# Patient Record
Sex: Male | Born: 1990 | Race: White | Hispanic: No | Marital: Married | Smoking: Never smoker
Health system: Southern US, Community
[De-identification: ages and names within clinical notes are randomized; demographics above are authoritative.]

## PROBLEM LIST (undated history)

## (undated) DIAGNOSIS — N189 Chronic kidney disease, unspecified: Secondary | ICD-10-CM

## (undated) DIAGNOSIS — Z789 Other specified health status: Secondary | ICD-10-CM

## (undated) DIAGNOSIS — I499 Cardiac arrhythmia, unspecified: Secondary | ICD-10-CM

## (undated) DIAGNOSIS — I10 Essential (primary) hypertension: Secondary | ICD-10-CM

## (undated) DIAGNOSIS — M1990 Unspecified osteoarthritis, unspecified site: Secondary | ICD-10-CM

## (undated) DIAGNOSIS — Z87442 Personal history of urinary calculi: Secondary | ICD-10-CM

## (undated) HISTORY — PX: NO PAST SURGERIES: SHX2092

---

## 2021-01-09 ENCOUNTER — Other Ambulatory Visit: Payer: Self-pay

## 2021-01-09 ENCOUNTER — Ambulatory Visit
Admission: EM | Admit: 2021-01-09 | Discharge: 2021-01-09 | Disposition: A | Discharge status: Home / Self Care | Payer: Self-pay | Attending: Physician Assistant | Admitting: Physician Assistant

## 2021-01-09 DIAGNOSIS — J029 Acute pharyngitis, unspecified: Secondary | ICD-10-CM | POA: Insufficient documentation

## 2021-01-09 LAB — POCT RAPID STREP A (OFFICE): Rapid Strep A Screen: NEGATIVE

## 2021-01-09 MED ORDER — AMOXICILLIN 500 MG PO CAPS: 500.0000 mg | ORAL_CAPSULE | Freq: Three times a day (TID) | ORAL | 0 refills | Status: DC

## 2021-01-09 NOTE — ED Triage Notes (Signed)
Pt c/o sore throat and nasal congestion x4 days. C/o white patches on tonsils and pain on swallowing.

## 2021-01-09 NOTE — ED Provider Notes (Signed)
[facility]    CSN: [id number] Arrival date & time: 01/09/21  1634      History   Chief Complaint Chief Complaint  Patient presents with   Sore Throat    HPI Vincent Doyle is a 30 y.o. male.   Patient here today for evaluation of sore throat, nasal congestion and drainage, and mild cough from drainage that started about 5 days ago.  He reports that he has had sore throat wax and wane.  Initially he thought symptoms were related to allergies specifically after he walked into a hoarders apartment without realizing that he needed a mask.  He has not had any fever or chills.  He denies any nausea, vomiting or diarrhea.  He has tried over-the-counter sinus medication without significant relief.  The history is provided by the patient.  Sore Throat Pertinent negatives include no abdominal pain and no shortness of breath.   History reviewed. No pertinent past medical history.  There are no problems to display for this patient.   History reviewed. No pertinent surgical history.     Home Medications    Prior to Admission medications   Medication Sig Start Date End Date Taking? Authorizing Provider  amoxicillin (AMOXIL) 500 MG capsule Take 1 capsule (500 mg total) by mouth 3 (three) times daily. 01/09/21  Yes Tomi Bamberger, PA-C    Family History Family History  Problem Relation Age of Onset   Heart failure Father    Hypertension Father     Social History Social History   Tobacco Use   Smoking status: Never   Smokeless tobacco: Never  Vaping Use   Vaping Use: Never used  Substance Use Topics   Alcohol use: Not Currently   Drug use: Not Currently     Allergies   Patient has no known allergies.   Review of Systems Review of Systems  Constitutional:  Negative for chills and fever.  HENT:  Positive for congestion and sore throat. Negative for ear pain.   Eyes:  Negative for discharge and redness.  Respiratory:  Positive for cough. Negative  for shortness of breath.   Gastrointestinal:  Negative for abdominal pain, nausea and vomiting.    Physical Exam Triage Vital Signs ED Triage Vitals [01/09/21 1758]  Enc Vitals Group     BP 104/80     Pulse Rate 98     Resp 18     Temp 98.5 F (36.9 C)     Temp Source Oral     SpO2 97 %     Weight      Height      Head Circumference      Peak Flow      Pain Score 1     Pain Loc      Pain Edu?      Excl. in GC?    No data found.  Updated Vital Signs BP 104/80   Pulse 98   Temp 98.5 F (36.9 C) (Oral)   Resp 18   SpO2 97%   Physical Exam Vitals and nursing note reviewed.  Constitutional:      General: He is not in acute distress.    Appearance: Normal appearance. He is not ill-appearing.  HENT:     Head: Normocephalic and atraumatic.     Nose: Nose normal. No congestion.     Mouth/Throat:     Mouth: Mucous membranes are moist.     Pharynx: Posterior oropharyngeal erythema present. No oropharyngeal exudate.  Tonsils: Tonsillar exudate present. 3+ on the right. 3+ on the left.  Eyes:     Conjunctiva/sclera: Conjunctivae normal.  Cardiovascular:     Rate and Rhythm: Normal rate and regular rhythm.     Heart sounds: Normal heart sounds. No murmur heard. Pulmonary:     Effort: Pulmonary effort is normal. No respiratory distress.     Breath sounds: Normal breath sounds. No wheezing, rhonchi or rales.  Skin:    General: Skin is warm and dry.  Neurological:     Mental Status: He is alert.  Psychiatric:        Mood and Affect: Mood normal.        Thought Content: Thought content normal.     UC Treatments / Results  Labs (all labs ordered are listed, but only abnormal results are displayed) Labs Reviewed  CULTURE, GROUP A STREP [facility])  POCT RAPID STREP A (OFFICE)    EKG   Radiology No results found.  Procedures Procedures (including critical care time)  Medications Ordered in UC Medications - No data to display  Initial Impression /  Assessment and Plan / UC Course  I have reviewed the triage vital signs and the nursing notes.  Pertinent labs & imaging results that were available during my care of the patient were reviewed by me and considered in my medical decision making (see chart for details).    Discussed possible viral versus allergic cause of symptoms but will treat with antibiotic given appearance of tonsils.  Her symptomatic treatment otherwise.  Recommended follow-up if symptoms fail to improve or worsen anyway.  Final Clinical Impressions(s) / UC Diagnoses   Final diagnoses:  Acute pharyngitis, unspecified etiology   Discharge Instructions   None    ED Prescriptions     Medication Sig Dispense Auth. Provider   amoxicillin (AMOXIL) 500 MG capsule Take 1 capsule (500 mg total) by mouth 3 (three) times daily. 21 capsule Tomi Bamberger, PA-C      PDMP not reviewed this encounter.   Tomi Bamberger, PA-C 01/09/21 1830

## 2021-01-13 LAB — CULTURE, GROUP A STREP (THRC)

## 2021-03-05 ENCOUNTER — Other Ambulatory Visit: Payer: Self-pay

## 2021-03-05 ENCOUNTER — Encounter: Payer: Self-pay | Admitting: Emergency Medicine

## 2021-03-05 ENCOUNTER — Ambulatory Visit
Admission: EM | Admit: 2021-03-05 | Discharge: 2021-03-05 | Disposition: A | Discharge status: Home / Self Care | Payer: Self-pay | Attending: Physician Assistant | Admitting: Physician Assistant

## 2021-03-05 DIAGNOSIS — J209 Acute bronchitis, unspecified: Secondary | ICD-10-CM

## 2021-03-05 MED ORDER — PREDNISONE 20 MG PO TABS: 40.0000 mg | ORAL_TABLET | Freq: Every day | ORAL | 0 refills | Status: AC

## 2021-03-05 NOTE — ED Triage Notes (Addendum)
Persistent cough since having a URI in October. Today starting feeling short of breath. Worsens with activity, talking. Productive cough with clear/white mucus. Noticed last week his ankles appear more swollen.

## 2021-03-05 NOTE — ED Provider Notes (Signed)
[facility]    CSN: [id number] Arrival date & time: 03/05/21  1520      History   Chief Complaint No chief complaint on file.   HPI Vincent Doyle is a 30 y.o. male.   Patient here today for evaluation of cough he has had for the last several months.  He reports that today he has felt more short of breath.  He reports cough is productive at times.  He has not had fever.  He has tried over-the-counter medication without significant relief.  The history is provided by the patient.   History reviewed. No pertinent past medical history.  There are no problems to display for this patient.   History reviewed. No pertinent surgical history.     Home Medications    Prior to Admission medications   Medication Sig Start Date End Date Taking? Authorizing Provider  predniSONE (DELTASONE) 20 MG tablet Take 2 tablets (40 mg total) by mouth daily with breakfast for 5 days. 03/05/21 03/10/21 Yes Tomi Bamberger, PA-C  amoxicillin (AMOXIL) 500 MG capsule Take 1 capsule (500 mg total) by mouth 3 (three) times daily. 01/09/21   Tomi Bamberger, PA-C    Family History Family History  Problem Relation Age of Onset   Heart failure Father    Hypertension Father     Social History Social History   Tobacco Use   Smoking status: Never   Smokeless tobacco: Never  Vaping Use   Vaping Use: Never used  Substance Use Topics   Alcohol use: Not Currently   Drug use: Not Currently     Allergies   Patient has no known allergies.   Review of Systems Review of Systems  Constitutional:  Negative for chills and fever.  HENT:  Positive for congestion. Negative for ear pain and sore throat.   Eyes:  Negative for discharge and redness.  Respiratory:  Positive for cough and shortness of breath.   Gastrointestinal:  Negative for abdominal pain, nausea and vomiting.    Physical Exam Triage Vital Signs ED Triage Vitals  Enc Vitals Group     BP      Pulse      Resp       Temp      Temp src      SpO2      Weight      Height      Head Circumference      Peak Flow      Pain Score      Pain Loc      Pain Edu?      Excl. in GC?    No data found.  Updated Vital Signs BP (!) 157/84 (BP Location: Left Arm)    Pulse 97    Temp 98.2 F (36.8 C)    Resp 16    SpO2 96%     Physical Exam Vitals and nursing note reviewed.  Constitutional:      General: He is not in acute distress.    Appearance: Normal appearance. He is not ill-appearing.  HENT:     Head: Normocephalic and atraumatic.     Nose: Nose normal. No congestion.     Mouth/Throat:     Mouth: Mucous membranes are moist.     Pharynx: Oropharynx is clear. No oropharyngeal exudate or posterior oropharyngeal erythema.  Eyes:     Conjunctiva/sclera: Conjunctivae normal.  Cardiovascular:     Rate and Rhythm: Normal rate and regular rhythm.  Heart sounds: Normal heart sounds. No murmur heard. Pulmonary:     Effort: Pulmonary effort is normal. No respiratory distress.     Breath sounds: Normal breath sounds. No wheezing, rhonchi or rales.     Comments: Deep breathing produces cough Skin:    General: Skin is warm and dry.  Neurological:     Mental Status: He is alert.  Psychiatric:        Mood and Affect: Mood normal.        Thought Content: Thought content normal.     UC Treatments / Results  Labs (all labs ordered are listed, but only abnormal results are displayed) Labs Reviewed - No data to display  EKG   Radiology No results found.  Procedures Procedures (including critical care time)  Medications Ordered in UC Medications - No data to display  Initial Impression / Assessment and Plan / UC Course  I have reviewed the triage vital signs and the nursing notes.  Pertinent labs & imaging results that were available during my care of the patient were reviewed by me and considered in my medical decision making (see chart for details).    Prednisone burst prescribed for  treatment of suspected bronchitis.  Recommend follow-up with any further concerns.  Final Clinical Impressions(s) / UC Diagnoses   Final diagnoses:  Acute bronchitis, unspecified organism   Discharge Instructions   None    ED Prescriptions     Medication Sig Dispense Auth. Provider   predniSONE (DELTASONE) 20 MG tablet Take 2 tablets (40 mg total) by mouth daily with breakfast for 5 days. 10 tablet Tomi Bamberger, PA-C      PDMP not reviewed this encounter.   Tomi Bamberger, PA-C 03/05/21 1724

## 2021-04-06 ENCOUNTER — Emergency Department (HOSPITAL_COMMUNITY)
Admission: EM | Admit: 2021-04-06 | Discharge: 2021-04-06 | Disposition: A | Discharge status: Home / Self Care | Payer: Self-pay | Attending: Emergency Medicine | Admitting: Emergency Medicine

## 2021-04-06 ENCOUNTER — Other Ambulatory Visit: Payer: Self-pay

## 2021-04-06 ENCOUNTER — Encounter (HOSPITAL_COMMUNITY): Payer: Self-pay

## 2021-04-06 ENCOUNTER — Emergency Department (HOSPITAL_COMMUNITY): Payer: Self-pay

## 2021-04-06 DIAGNOSIS — R002 Palpitations: Secondary | ICD-10-CM | POA: Insufficient documentation

## 2021-04-06 DIAGNOSIS — R000 Tachycardia, unspecified: Secondary | ICD-10-CM | POA: Insufficient documentation

## 2021-04-06 LAB — CBC WITH DIFFERENTIAL/PLATELET
Abs Immature Granulocytes: 0.02 10*3/uL (ref 0.00–0.07)
Basophils Absolute: 0.1 10*3/uL (ref 0.0–0.1)
Basophils Relative: 1 %
Eosinophils Absolute: 0.1 10*3/uL (ref 0.0–0.5)
Eosinophils Relative: 2 %
HCT: 45.6 % (ref 39.0–52.0)
Hemoglobin: 15.2 g/dL (ref 13.0–17.0)
Immature Granulocytes: 0 %
Lymphocytes Relative: 26 %
Lymphs Abs: 1.3 10*3/uL (ref 0.7–4.0)
MCH: 28.6 pg (ref 26.0–34.0)
MCHC: 33.3 g/dL (ref 30.0–36.0)
MCV: 85.9 fL (ref 80.0–100.0)
Monocytes Absolute: 0.4 10*3/uL (ref 0.1–1.0)
Monocytes Relative: 8 %
Neutro Abs: 3.2 10*3/uL (ref 1.7–7.7)
Neutrophils Relative %: 63 %
Platelets: 271 10*3/uL (ref 150–400)
RBC: 5.31 MIL/uL (ref 4.22–5.81)
RDW: 13.2 % (ref 11.5–15.5)
WBC: 5.1 10*3/uL (ref 4.0–10.5)
nRBC: 0 % (ref 0.0–0.2)

## 2021-04-06 LAB — BASIC METABOLIC PANEL
Anion gap: 5 (ref 5–15)
BUN: 14 mg/dL (ref 6–20)
CO2: 28 mmol/L (ref 22–32)
Calcium: 8.9 mg/dL (ref 8.9–10.3)
Chloride: 107 mmol/L (ref 98–111)
Creatinine, Ser: 0.54 mg/dL — ABNORMAL LOW (ref 0.61–1.24)
GFR, Estimated: >60 mL/min (ref >60)
Glucose, Bld: 92 mg/dL (ref 70–99)
Potassium: 3.8 mmol/L (ref 3.5–5.1)
Sodium: 140 mmol/L (ref 135–145)

## 2021-04-06 LAB — TSH: TSH: 0.861 u[IU]/mL (ref 0.350–4.500)

## 2021-04-06 NOTE — ED Provider Notes (Signed)
[facility]-EMERGENCY DEPT Provider Note   CSN: [id number] Arrival date & time: 04/06/21  1528     History  Chief Complaint  Patient presents with   Palpitations    Vincent Doyle is a 31 y.o. male.  31 year old male with prior medical history as detailed below presents for evaluation.  Patient reports intermittent sensation of palpitations today.  Patient reports feeling "extra heartbeats" intermittently for the last several hours.  Patient reports that he will feel extra beats for several seconds at a time.  Symptoms then go away but will recur within 20 to 30 minutes.  He denies associated chest pain, dizziness, nausea, vomiting, shortness of breath, or other complaint.  He denies prior episodes of palpitations.  He denies any specific inciting event.  He is comfortable during the evaluation.  The history is provided by medical records and the patient.  Palpitations Palpitations quality:  Regular Onset quality:  Sudden Duration:  5 seconds Timing:  Intermittent Progression:  Waxing and waning Chronicity:  New Relieved by:  Nothing Worsened by:  Nothing     Home Medications Prior to Admission medications   Medication Sig Start Date End Date Taking? Authorizing Provider  amoxicillin (AMOXIL) 500 MG capsule Take 1 capsule (500 mg total) by mouth 3 (three) times daily. 01/09/21   Tomi Bamberger, PA-C      Allergies    Patient has no known allergies.    Review of Systems   Review of Systems  Cardiovascular:  Positive for palpitations.  All other systems reviewed and are negative.  Physical Exam Updated Vital Signs BP (!) 169/99 (BP Location: Left Arm)    Pulse (!) 102    Temp 98.4 F (36.9 C) (Oral)    Resp 14    Ht 6\' 4"  (1.93 m)    Wt (!) 147.4 kg    SpO2 99%    BMI 39.56 kg/m  Physical Exam Vitals and nursing note reviewed.  Constitutional:      General: He is not in acute distress.    Appearance: Normal appearance. He is  well-developed.  HENT:     Head: Normocephalic and atraumatic.  Eyes:     Conjunctiva/sclera: Conjunctivae normal.     Pupils: Pupils are equal, round, and reactive to light.  Cardiovascular:     Rate and Rhythm: Normal rate and regular rhythm.     Heart sounds: Normal heart sounds.  Pulmonary:     Effort: Pulmonary effort is normal. No respiratory distress.     Breath sounds: Normal breath sounds.  Abdominal:     General: There is no distension.     Palpations: Abdomen is soft.     Tenderness: There is no abdominal tenderness.  Musculoskeletal:        General: No deformity. Normal range of motion.     Cervical back: Normal range of motion and neck supple.  Skin:    General: Skin is warm and dry.  Neurological:     General: No focal deficit present.     Mental Status: He is alert and oriented to person, place, and time.    ED Results / Procedures / Treatments   Labs (all labs ordered are listed, but only abnormal results are displayed) Labs Reviewed  BASIC METABOLIC PANEL - Abnormal; Notable for the following components:      Result Value   Creatinine, Ser 0.54 (*)    All other components within normal limits  CBC WITH DIFFERENTIAL/PLATELET  TSH  EKG EKG Interpretation  Date/Time:  Sunday April 06 2021 15:37:04 EST Ventricular Rate:  101 PR Interval:  168 QRS Duration: 114 QT Interval:  345 QTC Calculation: 448 R Axis:   66 Text Interpretation: Sinus tachycardia Anterior infarct, old Confirmed by Kristine Royal [phone]) on 04/06/2021 3:55:38 PM  Radiology DG Chest 2 View  Result Date: 04/06/2021 CLINICAL DATA:  Palpitations for 2 hours.  Chronic cough. EXAM: CHEST - 2 VIEW COMPARISON:  None. FINDINGS: The heart size and mediastinal contours are within normal limits. Both lungs are clear. The visualized skeletal structures are unremarkable. IMPRESSION: No active cardiopulmonary disease. Electronically Signed   By: Danae Orleans M.D.   On: 04/06/2021 17:07     Procedures Procedures    Medications Ordered in ED Medications - No data to display  ED Course/ Medical Decision Making/ A&P                           Medical Decision Making Amount and/or Complexity of Data Reviewed Labs: ordered. Radiology: ordered.    Medical Screen Complete  This patient presented to the ED with complaint of palpitations.  This complaint involves an extensive number of treatment options. The initial differential diagnosis includes, but is not limited to, PVC, arrhythmia, metabolic abnormality, thyroid dysfunction, etc.  This presentation is: Acute, Previously Undiagnosed, Uncertain Prognosis, Complicated, Systemic Symptoms, and Threat to Life/Bodily Function  Patient presented with complaint of palpitations.  Patient's described symptoms are not related to other more significant symptomatology such as chest pain or shortness of breath.  Patient's work-up is without evidence of acute pathology.  Patient is reassured by work-up.  Importance of close follow-up is stressed.  Strict return precautions given understood.     Additional history obtained:  Additional history obtained from Spouse External records from outside sources obtained and reviewed including prior ED visits and prior Inpatient records.    Lab Tests:  I ordered and personally interpreted labs.  The pertinent results include:  cbc bmp tsh   Imaging Studies ordered:  I ordered imaging studies including cxr  I independently visualized and interpreted obtained imaging which showed nad I agree with the radiologist interpretation.   Cardiac Monitoring:  The patient was maintained on a cardiac monitor.  I personally viewed and interpreted the cardiac monitor which showed an underlying rhythm of: nsr      Problem List / ED Course:  Palpitations   Reevaluation:  After the interventions noted above, I reevaluated the patient and found that they have:  improved   Disposition:  After consideration of the diagnostic results and the patients response to treatment, I feel that the patent would benefit from close outpatient followup.          Final Clinical Impression(s) / ED Diagnoses Final diagnoses:  Palpitations    Rx / DC Orders ED Discharge Orders     None         [name] Fines, MD 04/06/21 1851

## 2021-04-06 NOTE — ED Triage Notes (Signed)
Pt c/o heart palpitations since 1300. Pt states he can feel the heartbeat, becomes SOB during. Denies CP, n/v, dizziness, states has not seen PMD in 10+ years

## 2021-04-06 NOTE — Discharge Instructions (Signed)
Return for any problem.  ?

## 2021-11-04 ENCOUNTER — Ambulatory Visit
Admission: EM | Admit: 2021-11-04 | Discharge: 2021-11-04 | Disposition: A | Discharge status: Home / Self Care | Payer: Self-pay | Attending: Urgent Care | Admitting: Urgent Care

## 2021-11-04 ENCOUNTER — Ambulatory Visit (INDEPENDENT_AMBULATORY_CARE_PROVIDER_SITE_OTHER): Payer: Self-pay

## 2021-11-04 DIAGNOSIS — M5412 Radiculopathy, cervical region: Secondary | ICD-10-CM

## 2021-11-04 DIAGNOSIS — R200 Anesthesia of skin: Secondary | ICD-10-CM

## 2021-11-04 DIAGNOSIS — R202 Paresthesia of skin: Secondary | ICD-10-CM

## 2021-11-04 DIAGNOSIS — M542 Cervicalgia: Secondary | ICD-10-CM

## 2021-11-04 MED ORDER — TIZANIDINE HCL 4 MG PO TABS: 4.0000 mg | ORAL_TABLET | Freq: Every day | ORAL | 0 refills | Status: DC

## 2021-11-04 MED ORDER — NAPROXEN 375 MG PO TABS: 375.0000 mg | ORAL_TABLET | Freq: Two times a day (BID) | ORAL | 0 refills | Status: DC

## 2021-11-04 MED ORDER — PREDNISONE 50 MG PO TABS
50.0000 mg | ORAL_TABLET | Freq: Every day | ORAL | 0 refills | Status: DC
Start: 2021-11-04 — End: 2023-09-15

## 2021-11-04 NOTE — ED Triage Notes (Signed)
Pt. Stated he was moving furniture Friday.Pt. is c/o of neck pain that radiates to the shoulder blades since Saturday. Pt. Has been treating w/ tylenol and it has not helped.

## 2021-11-04 NOTE — ED Provider Notes (Signed)
[facility]   MRN: [id number] DOB: [birth year]  Subjective:   Vincent Doyle is a 31 y.o. male presenting for 5-day history of acute onset persistent neck pain worse to the right with radiation down his trapezius toward the shoulder blades internally.  Is also had radiation toward the right side including numbness and tingling of the right arm.  Symptoms started after he was trying to move some furniture.  Reports that it was not excessive lifting as much as it was lifting together with someone else and trying to position pieces of furniture into a truck bed.  No direct trauma to the neck, falls.  No history of musculoskeletal disorders.  No current facility-administered medications for this encounter.  Current Outpatient Medications:    amoxicillin (AMOXIL) 500 MG capsule, Take 1 capsule (500 mg total) by mouth 3 (three) times daily., Disp: 21 capsule, Rfl: 0   No Known Allergies  History reviewed. No pertinent past medical history.   History reviewed. No pertinent surgical history.  Family History  Problem Relation Age of Onset   Heart failure Father    Hypertension Father     Social History   Tobacco Use   Smoking status: Never   Smokeless tobacco: Never  Vaping Use   Vaping Use: Never used  Substance Use Topics   Alcohol use: Not Currently   Drug use: Not Currently    ROS   Objective:   Vitals: BP (!) 146/103   Pulse 94   Temp 98.6 F (37 C)   Resp 18   SpO2 96%   BP Readings from Last 3 Encounters:  11/04/21 (!) 146/103  04/06/21 134/82  03/05/21 (!) 157/84   Physical Exam Constitutional:      General: He is not in acute distress.    Appearance: Normal appearance. He is well-developed and normal weight. He is not ill-appearing, toxic-appearing or diaphoretic.  HENT:     Head: Normocephalic and atraumatic.     Right Ear: External ear normal.     Left Ear: External ear normal.     Nose: Nose normal.     Mouth/Throat:      Pharynx: Oropharynx is clear.  Eyes:     General: No scleral icterus.       Right eye: No discharge.        Left eye: No discharge.     Extraocular Movements: Extraocular movements intact.  Cardiovascular:     Rate and Rhythm: Normal rate.  Pulmonary:     Effort: Pulmonary effort is normal.  Musculoskeletal:     Cervical back: Spasms and tenderness (Worse to the right paraspinal muscles, positive Spurling maneuver to the right) present. No swelling, edema, deformity, erythema, signs of trauma, lacerations, rigidity, torticollis, bony tenderness or crepitus. Pain with movement present. Decreased range of motion.  Neurological:     Mental Status: He is alert and oriented to person, place, and time.     Motor: No weakness.     Coordination: Coordination normal.     Gait: Gait normal.     Deep Tendon Reflexes: Reflexes normal.  Psychiatric:        Mood and Affect: Mood normal.        Behavior: Behavior normal.        Thought Content: Thought content normal.        Judgment: Judgment normal.     X-ray result did not crossover but was negative for an acute process.  Patient did have  loss of lordosis.   Assessment and Plan :   PDMP not reviewed this encounter.  1. Cervical radiculopathy   2. Neck pain   3. Numbness and tingling of right arm    Given the elements of cervical radiculopathy, recommended an oral prednisone course for the next 3 days.  Use naproxen for pain and inflammation thereafter.  Can start tizanidine today.  Follow-up with the neuro spine specialist if symptoms continue. Counseled patient on potential for adverse effects with medications prescribed/recommended today, ER and return-to-clinic precautions discussed, patient verbalized understanding.    Wallis Bamberg, [place] 11/04/21 1241

## 2022-05-17 ENCOUNTER — Other Ambulatory Visit: Payer: Self-pay

## 2022-05-17 ENCOUNTER — Emergency Department (HOSPITAL_BASED_OUTPATIENT_CLINIC_OR_DEPARTMENT_OTHER)
Admission: EM | Admit: 2022-05-17 | Discharge: 2022-05-17 | Disposition: A | Discharge status: Home / Self Care | Payer: Self-pay | Attending: Emergency Medicine | Admitting: Emergency Medicine

## 2022-05-17 DIAGNOSIS — Z20822 Contact with and (suspected) exposure to covid-19: Secondary | ICD-10-CM | POA: Insufficient documentation

## 2022-05-17 DIAGNOSIS — J101 Influenza due to other identified influenza virus with other respiratory manifestations: Secondary | ICD-10-CM | POA: Insufficient documentation

## 2022-05-17 DIAGNOSIS — R000 Tachycardia, unspecified: Secondary | ICD-10-CM | POA: Insufficient documentation

## 2022-05-17 LAB — RESP PANEL BY RT-PCR (RSV, FLU A&B, COVID)  RVPGX2
Influenza A by PCR: NEGATIVE
Influenza B by PCR: POSITIVE — AB
Resp Syncytial Virus by PCR: NEGATIVE
SARS Coronavirus 2 by RT PCR: NEGATIVE

## 2022-05-17 MED ORDER — ACETAMINOPHEN 500 MG PO TABS: 500.0000 mg | ORAL_TABLET | Freq: Four times a day (QID) | ORAL | 0 refills | Status: DC | PRN

## 2022-05-17 MED ORDER — BENZONATATE 100 MG PO CAPS: 100.0000 mg | ORAL_CAPSULE | Freq: Three times a day (TID) | ORAL | 0 refills | Status: DC

## 2022-05-17 NOTE — ED Provider Notes (Signed)
[facility] Provider Note   CSN: [id number] Arrival date & time: 05/17/22  1112     History  Chief Complaint  Patient presents with   URI   Cough    Vincent Doyle is a 32 y.o. male.  The history is provided by the patient and medical records. No language interpreter was used.     32 year old male presenting to ED with flulike symptoms.  Patient report for the past 4 days he has had fever, chills, headache, body aches, congestion, coughing, sore throat, diarrhea, decrease in appetite, heart palpitation and overall not feeling well.  Endorsing lightheadedness.  He has tried numerous over-the-counter medication with some relief.  States his wife initially started with similar symptoms and is here with the same sickness.  He denies alcohol or tobacco use.  No shortness of breath no urinary symptoms.  Home Medications Prior to Admission medications   Medication Sig Start Date End Date Taking? Authorizing Provider  amoxicillin (AMOXIL) 500 MG capsule Take 1 capsule (500 mg total) by mouth 3 (three) times daily. 01/09/21   Francene Finders, PA-C  naproxen (NAPROSYN) 375 MG tablet Take 1 tablet (375 mg total) by mouth 2 (two) times daily with a meal. 11/04/21   Jaynee Eagles, PA-C  predniSONE (DELTASONE) 50 MG tablet Take 1 tablet (50 mg total) by mouth daily with breakfast. 11/04/21   Jaynee Eagles, PA-C  tiZANidine (ZANAFLEX) 4 MG tablet Take 1 tablet (4 mg total) by mouth at bedtime. 11/04/21   Jaynee Eagles, PA-C      Allergies    Patient has no known allergies.    Review of Systems   Review of Systems  All other systems reviewed and are negative.   Physical Exam Updated Vital Signs BP 122/75   Pulse 92   Temp 99 F (37.2 C) (Oral)   Resp 10   Ht '6\' 4"'$  (1.93 m)   Wt (!) 147.4 kg   SpO2 90%   BMI 39.56 kg/m  Physical Exam Vitals and nursing note reviewed.  Constitutional:      General: He is not in acute distress.     Appearance: He is well-developed.  HENT:     Head: Atraumatic.     Nose: Nose normal.     Mouth/Throat:     Mouth: Mucous membranes are moist.  Eyes:     Conjunctiva/sclera: Conjunctivae normal.  Cardiovascular:     Rate and Rhythm: Tachycardia present.  Pulmonary:     Effort: Pulmonary effort is normal.     Breath sounds: Normal breath sounds. No wheezing, rhonchi or rales.  Abdominal:     Palpations: Abdomen is soft.     Tenderness: There is no abdominal tenderness.  Musculoskeletal:     Cervical back: Neck supple.  Skin:    Findings: No rash.  Neurological:     Mental Status: He is alert.     ED Results / Procedures / Treatments   Labs (all labs ordered are listed, but only abnormal results are displayed) Labs Reviewed  RESP PANEL BY RT-PCR (RSV, FLU A&B, COVID)  [id number] - Abnormal; Notable for the following components:      Result Value   Influenza B by PCR POSITIVE (*)    All other components within normal limits    EKG None  Radiology No results found.  Procedures Procedures    Medications Ordered in ED Medications - No data to display  ED Course/ Medical Decision Making/  A&P                             Medical Decision Making  BP (!) 126/57 (BP Location: Right Arm)   Pulse (!) 105   Temp 99 F (37.2 C) (Oral)   Resp 20   Ht '6\' 4"'$  (1.93 m)   Wt (!) 147.4 kg   SpO2 98%   BMI 39.56 kg/m    32 y.o. male presenting with flu sxs.  Obtained influenza A/B screen, which revealed positive influenza B.  At this time, patient's presentation most consistent with influenza.  The following were considered in the patient's differential diagnosis but was not deemed to be consistent with patient's history of present illness and/or physical examination; meningitis, pharyngitis, otitis media, pneumonia, urinary tract infection, peritonsillar abscess, retropharyngeal abscess.  As patient does not present w/ any signs/symptoms of pneumonia or other complications,  deferred CXR or further labwork at this time. Educated patient on diagnosis and natural course of influenza.  Supportive care and preventive measures were discussed.  Continue fluid hydration. Follow up with primary physician in 3-5 days if symptoms continue or new problems arise. Return to ED if high fever, altered mental status, shortness of breath, uncontrolled vomiting, or other concers.  Impression: Influenza B  Plan:    * Discharge from ED      * Advised Pt on support therapies, including rest, advancement of fluids as tolerated, thorough handwashing w/ soap and H2O, taking OTC ibuprofen or acetaminophen as directed prn for F/C and myalgias, OTC expectorant/antitussive/decongestants as directed prn, and refraining from taking ASA.   * Advised Pt to refrain from visiting work, school, or daycares or visiting pregnant women, elderly, or those w/ chronic illnesses.   * Advised Pt to obtain annual influenza vaccine upon resolution of CC.   * Advised Pt to monitor for dyspnea, respiratory distress, worsening F/C, and signs of dehydration (xerostomia, polydipsia, oliguria, weakness, and constitutional Sx). Instructed Pt to f/up w/ PCP or ER should Sx worsen or not improve. Pt verbally expressed understanding and all questions were addressed to Pt's satisfaction.         Final Clinical Impression(s) / ED Diagnoses Final diagnoses:  None    Rx / DC Orders ED Discharge Orders     None         Domenic Moras, PA-C 05/17/22 1307    Gareth Morgan, MD 05/18/22 0003

## 2022-05-17 NOTE — ED Triage Notes (Signed)
Patient arrives ambulatory to room with complaints of cough, congestion/fatigue, and feeling like he has a fever x4 days.   Also reports feeling lightheaded (as if he will pass out, per patient).

## 2022-05-17 NOTE — ED Notes (Signed)
Discharge paperwork given and verbally understood. 

## 2023-01-15 IMAGING — DX DG CHEST 2V
2 series · 2 of 2 positions shown · non-contrast
Comparison: None.

CLINICAL DATA: Palpitations for 2 hours.  Chronic cough.

EXAM:
CHEST - 2 VIEW

[chest pa]
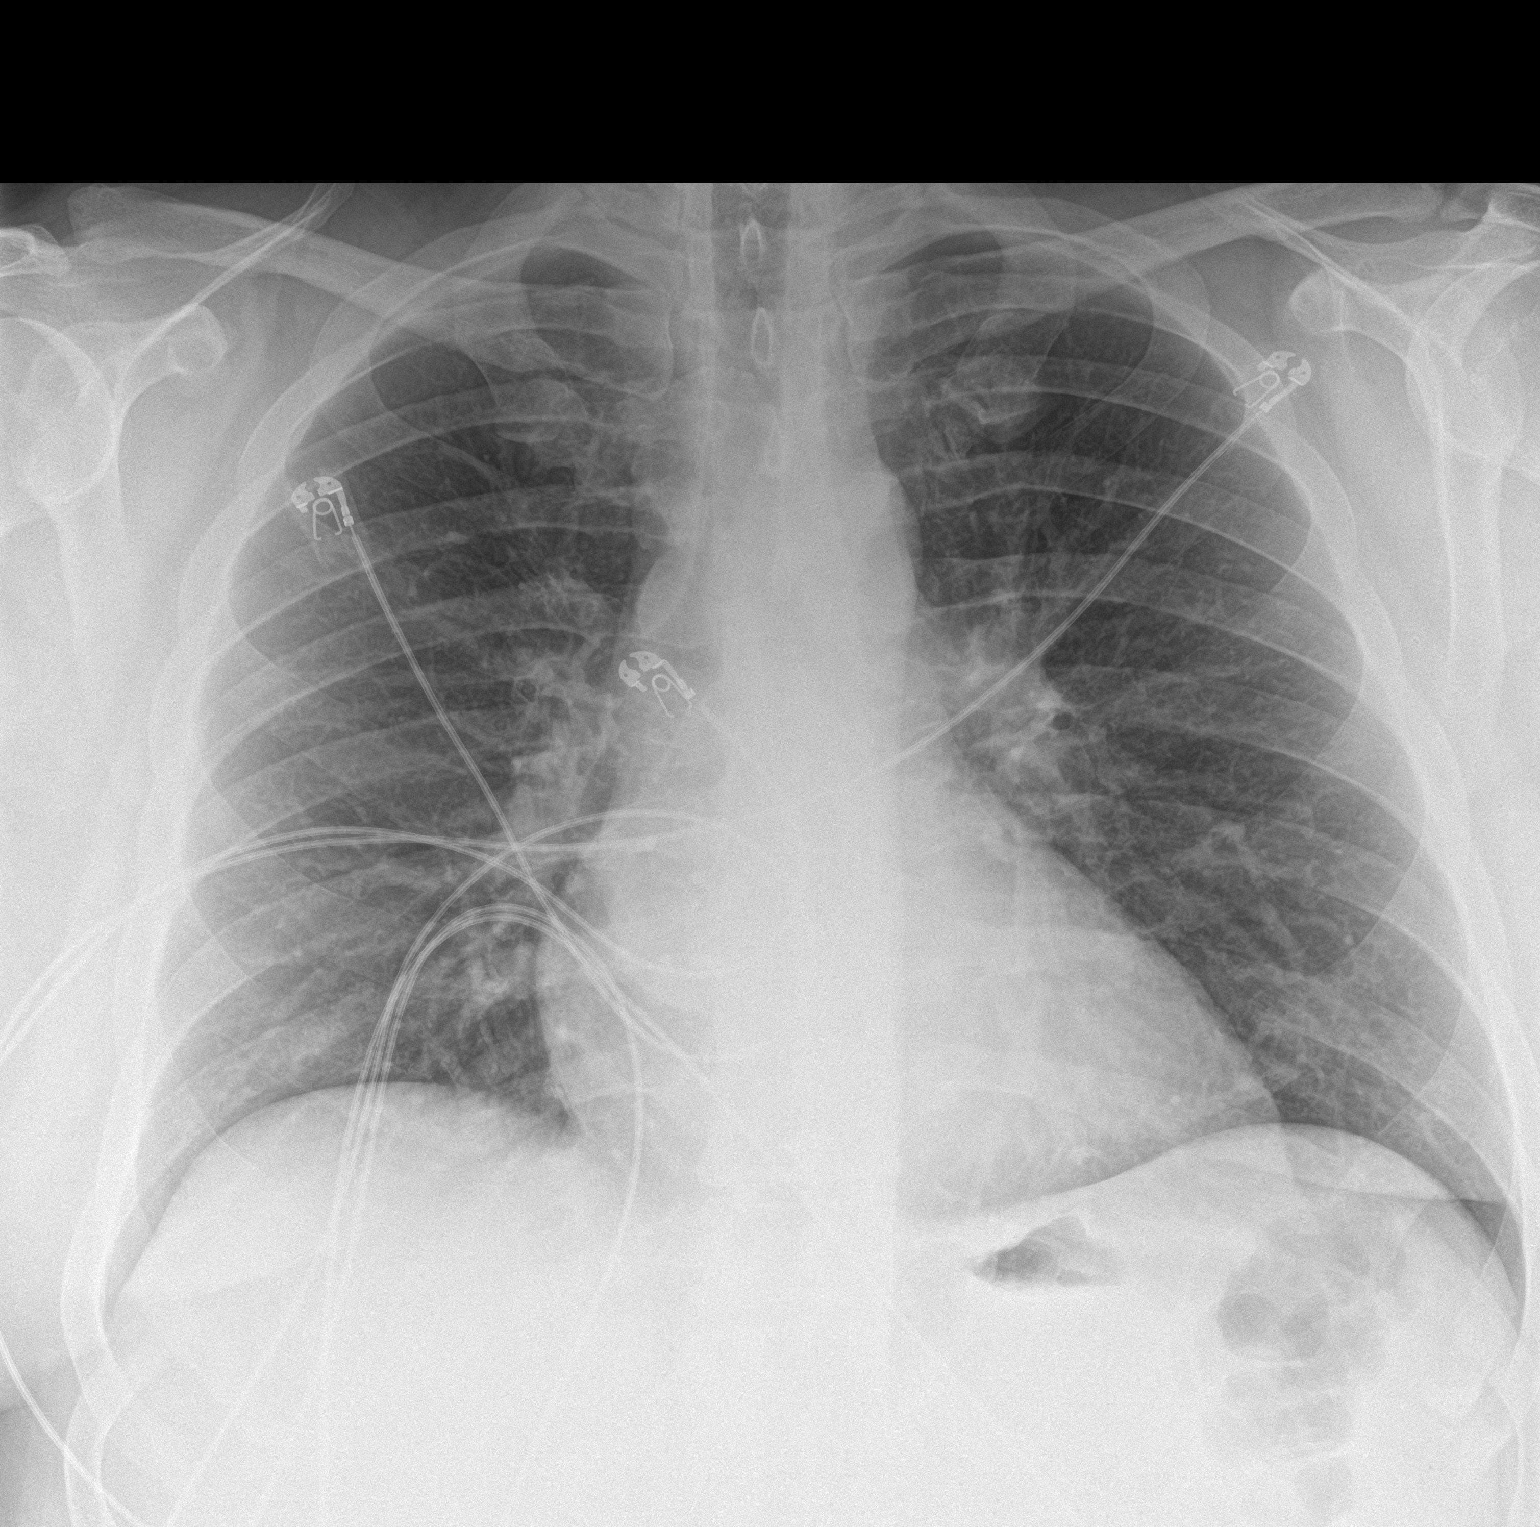

[chest lat]
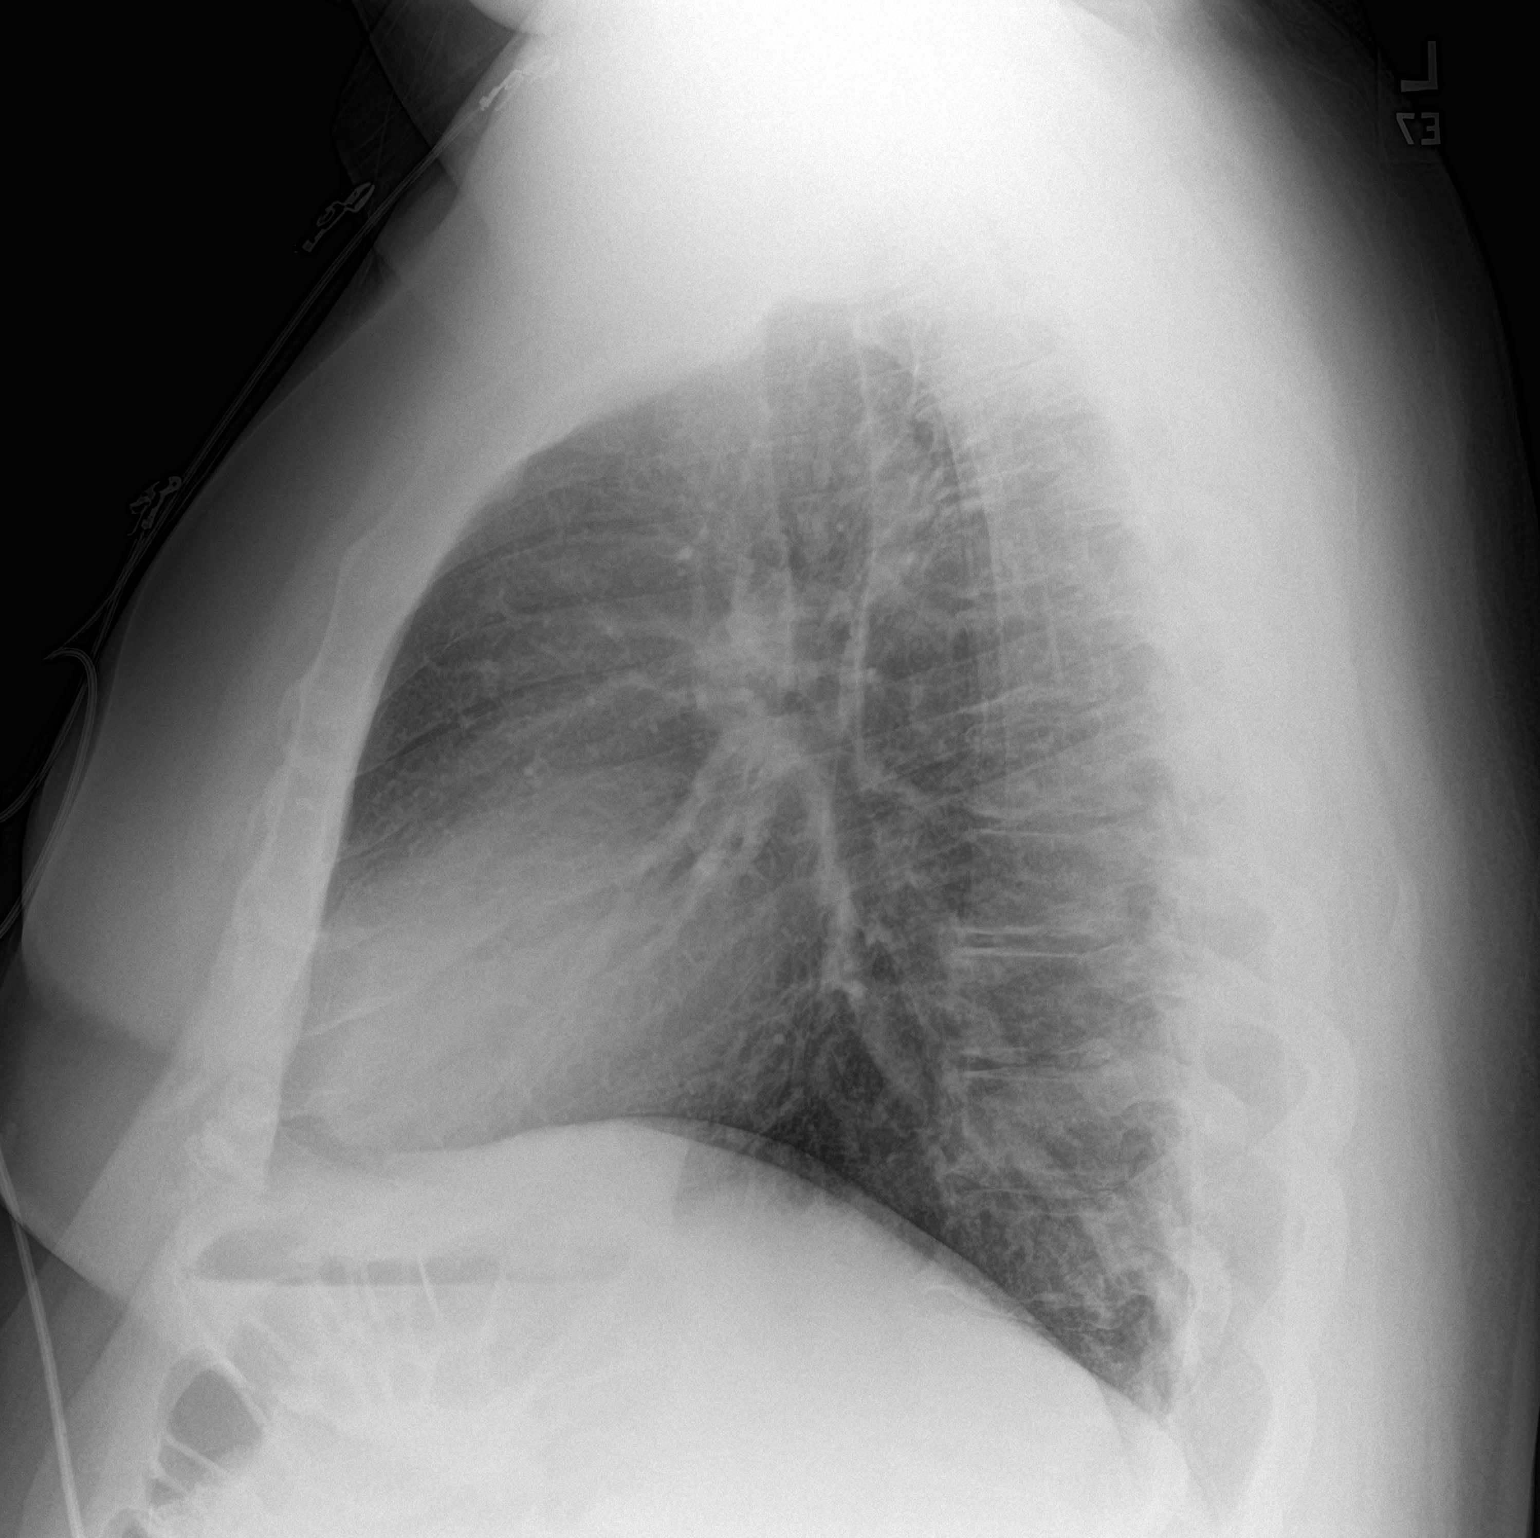

[2 of 2 positions shown; findings below may reference images not displayed]

FINDINGS: The heart size and mediastinal contours are within normal limits.
Both lungs are clear. The visualized skeletal structures are
unremarkable.
IMPRESSION: No active cardiopulmonary disease.

## 2023-08-24 DIAGNOSIS — N132 Hydronephrosis with renal and ureteral calculous obstruction: Secondary | ICD-10-CM | POA: Insufficient documentation

## 2023-08-25 ENCOUNTER — Emergency Department (HOSPITAL_COMMUNITY): Payer: Self-pay

## 2023-08-25 ENCOUNTER — Other Ambulatory Visit: Payer: Self-pay

## 2023-08-25 ENCOUNTER — Emergency Department (HOSPITAL_COMMUNITY)
Admission: EM | Admit: 2023-08-25 | Discharge: 2023-08-25 | Disposition: A | Discharge status: Home / Self Care | Payer: Self-pay | Attending: Emergency Medicine | Admitting: Emergency Medicine

## 2023-08-25 ENCOUNTER — Encounter (HOSPITAL_COMMUNITY): Payer: Self-pay | Admitting: Emergency Medicine

## 2023-08-25 DIAGNOSIS — N200 Calculus of kidney: Secondary | ICD-10-CM

## 2023-08-25 DIAGNOSIS — N134 Hydroureter: Secondary | ICD-10-CM

## 2023-08-25 LAB — URINALYSIS, ROUTINE W REFLEX MICROSCOPIC
Bacteria, UA: NONE SEEN
Bilirubin Urine: NEGATIVE
Glucose, UA: NEGATIVE mg/dL
Ketones, ur: 5 mg/dL — AB
Nitrite: NEGATIVE
Protein, ur: NEGATIVE mg/dL
Specific Gravity, Urine: 1.023 (ref 1.005–1.030)
pH: 7 (ref 5.0–8.0)

## 2023-08-25 LAB — COMPREHENSIVE METABOLIC PANEL WITH GFR
ALT: 24 U/L (ref 0–44)
AST: 21 U/L (ref 15–41)
Albumin: 4.1 g/dL (ref 3.5–5.0)
Alkaline Phosphatase: 78 U/L (ref 38–126)
Anion gap: 13 (ref 5–15)
BUN: 13 mg/dL (ref 6–20)
CO2: 22 mmol/L (ref 22–32)
Calcium: 8.9 mg/dL (ref 8.9–10.3)
Chloride: 103 mmol/L (ref 98–111)
Creatinine, Ser: 0.91 mg/dL (ref 0.61–1.24)
GFR, Estimated: >60 mL/min (ref >60)
Glucose, Bld: 121 mg/dL — ABNORMAL HIGH (ref 70–99)
Potassium: 3.5 mmol/L (ref 3.5–5.1)
Sodium: 138 mmol/L (ref 135–145)
Total Bilirubin: 0.7 mg/dL (ref 0.0–1.2)
Total Protein: 7.5 g/dL (ref 6.5–8.1)

## 2023-08-25 LAB — CBC
HCT: 45.3 % (ref 39.0–52.0)
Hemoglobin: 15.3 g/dL (ref 13.0–17.0)
MCH: 28.4 pg (ref 26.0–34.0)
MCHC: 33.8 g/dL (ref 30.0–36.0)
MCV: 84.2 fL (ref 80.0–100.0)
Platelets: 283 10*3/uL (ref 150–400)
RBC: 5.38 MIL/uL (ref 4.22–5.81)
RDW: 13 % (ref 11.5–15.5)
WBC: 10.9 10*3/uL — ABNORMAL HIGH (ref 4.0–10.5)
nRBC: 0 % (ref 0.0–0.2)

## 2023-08-25 LAB — LIPASE, BLOOD: Lipase: 25 U/L (ref 11–51)

## 2023-08-25 MED ORDER — ONDANSETRON 4 MG PO TBDP: 4.0000 mg | ORAL_TABLET | Freq: Four times a day (QID) | ORAL | 0 refills | Status: DC | PRN

## 2023-08-25 MED ORDER — HYDRALAZINE HCL 20 MG/ML IJ SOLN
10.0000 mg | Freq: Once | INTRAMUSCULAR | Status: AC
Start: 2023-08-25 — End: 2023-08-25
  Administered 2023-08-25: 10 mg via INTRAVENOUS
  Filled 2023-08-25: qty 1

## 2023-08-25 MED ORDER — KETOROLAC TROMETHAMINE 15 MG/ML IJ SOLN
15.0000 mg | Freq: Once | INTRAMUSCULAR | Status: AC
  Administered 2023-08-25: 15 mg via INTRAVENOUS
  Filled 2023-08-25: qty 1

## 2023-08-25 MED ORDER — ONDANSETRON HCL 4 MG/2ML IJ SOLN
4.0000 mg | Freq: Once | INTRAMUSCULAR | Status: AC
Start: 2023-08-25 — End: 2023-08-25
  Administered 2023-08-25: 4 mg via INTRAVENOUS
  Filled 2023-08-25: qty 2

## 2023-08-25 MED ORDER — IOHEXOL 300 MG/ML  SOLN
100.0000 mL | Freq: Once | INTRAMUSCULAR | Status: AC | PRN
  Administered 2023-08-25: 100 mL via INTRAVENOUS

## 2023-08-25 MED ORDER — KETOROLAC TROMETHAMINE 10 MG PO TABS: 10.0000 mg | ORAL_TABLET | Freq: Four times a day (QID) | ORAL | 0 refills | Status: DC | PRN

## 2023-08-25 MED ORDER — HYDROCODONE-ACETAMINOPHEN 5-325 MG PO TABS
1.0000 | ORAL_TABLET | Freq: Four times a day (QID) | ORAL | 0 refills | Status: DC | PRN
Start: 2023-08-25 — End: 2023-09-15

## 2023-08-25 MED ORDER — DICYCLOMINE HCL 10 MG PO CAPS
10.0000 mg | ORAL_CAPSULE | Freq: Once | ORAL | Status: AC
  Administered 2023-08-25: 10 mg via ORAL
  Filled 2023-08-25: qty 1

## 2023-08-25 MED ORDER — SODIUM CHLORIDE 0.9 % IV BOLUS
1000.0000 mL | Freq: Once | INTRAVENOUS | Status: AC
  Administered 2023-08-25: 1000 mL via INTRAVENOUS

## 2023-08-25 MED ORDER — SODIUM CHLORIDE (PF) 0.9 % IJ SOLN
INTRAMUSCULAR | Status: AC
  Filled 2023-08-25: qty 50

## 2023-08-25 NOTE — Discharge Instructions (Addendum)
 As we discussed you have an obstructing stone in the renal pelvis.  It is not currently passing through the ureter, but due to the size I do not think that it will be able to pass through this junction on its own.  Please follow-up with the urology group's contact formation provided above.  Please return to the emergency department if your pain significantly worsens despite medications.  Please use Tylenol  or ibuprofen for pain.  You may use 600 mg ibuprofen every 6 hours or 1000 mg of Tylenol  every 6 hours.  You may choose to alternate between the 2.  This would be most effective.  Not to exceed 4 g of Tylenol  within 24 hours.  Not to exceed 3200 mg ibuprofen 24 hours.  You can use the stronger narcotic pain medication in place of Tylenol  for severe break through pain.  If you take the narcotic pain medication that we prescribed recommend that you also take a laxative such as MiraLAX or Dulcolax every day that you take the narcotic pain medicine, and drink plenty of fluids, 50 to 64 ounces to prevent any constipation.

## 2023-08-25 NOTE — ED Triage Notes (Signed)
 Patient c/o abdominal pain x 1 month. Patient report worsening RLQ abdominal pain tonight. Patient report nausea and vomiting x 1 tonight. Patient denies fever. Patient denies chest pain and SOB.

## 2023-08-25 NOTE — ED Provider Notes (Signed)
 [facility] EMERGENCY DEPARTMENT AT [facility] Provider Note   CSN: [id number] Arrival date & time: 08/24/23  2358     Patient presents with: Abdominal Pain   Vincent Doyle is a 33 y.o. male with overall noncontributory past medical history, no previous intra-abdominal surgery history who presents with concern for abdominal pain intermittently for 1 to 2 months, most focally in right lower quadrant.  He reports that it was happening persistently at around 10 AM every day that he would have 1-2 discomfort pain that would go away spontaneously.  Did not seem to be worse with eating.  Denies any constipation.  Reports that worsened over the last couple of days, rated pain 6/10 on arrival.  Endorses some nausea and vomiting tonight.  Denies any fever, chills.  Denies any chest pain or shortness of breath.  Reports that his appetite has been normal throughout the abdominal pain.    Abdominal Pain      Prior to Admission medications   Medication Sig Start Date End Date Taking? Authorizing Provider  HYDROcodone-acetaminophen  (NORCO/VICODIN) 5-325 MG tablet Take 1 tablet by mouth every 6 (six) hours as needed. 08/25/23  Yes Walsie Smeltz H, PA-C  ketorolac (TORADOL) 10 MG tablet Take 1 tablet (10 mg total) by mouth every 6 (six) hours as needed. 08/25/23  Yes Adalid Beckmann H, PA-C  ondansetron (ZOFRAN-ODT) 4 MG disintegrating tablet Take 1 tablet (4 mg total) by mouth every 6 (six) hours as needed for nausea or vomiting. 08/25/23  Yes Mckynzi Cammon H, PA-C  acetaminophen  (TYLENOL ) 500 MG tablet Take 1 tablet (500 mg total) by mouth every 6 (six) hours as needed. 05/17/22   Debbra Fairy, PA-C  amoxicillin  (AMOXIL ) 500 MG capsule Take 1 capsule (500 mg total) by mouth 3 (three) times daily. 01/09/21   Vernestine Gondola, PA-C  benzonatate  (TESSALON ) 100 MG capsule Take 1 capsule (100 mg total) by mouth every 8 (eight) hours. 05/17/22   Debbra Fairy, PA-C  naproxen  (NAPROSYN )  375 MG tablet Take 1 tablet (375 mg total) by mouth 2 (two) times daily with a meal. 11/04/21   Adolph Hoop, PA-C  predniSONE  (DELTASONE ) 50 MG tablet Take 1 tablet (50 mg total) by mouth daily with breakfast. 11/04/21   Adolph Hoop, PA-C  tiZANidine  (ZANAFLEX ) 4 MG tablet Take 1 tablet (4 mg total) by mouth at bedtime. 11/04/21   Adolph Hoop, PA-C    Allergies: Patient has no known allergies.    Review of Systems  Gastrointestinal:  Positive for abdominal pain.  All other systems reviewed and are negative.   Updated Vital Signs BP (!) 155/93   Pulse 93   Temp 98.2 F (36.8 C) (Oral)   Resp 18   Ht 6' 4 (1.93 m)   Wt (!) 147.4 kg   SpO2 94%   BMI 39.56 kg/m   Physical Exam Vitals and nursing note reviewed.  Constitutional:      General: He is not in acute distress.    Appearance: Normal appearance.  HENT:     Head: Normocephalic and atraumatic.   Eyes:     General:        Right eye: No discharge.        Left eye: No discharge.    Cardiovascular:     Rate and Rhythm: Normal rate and regular rhythm.     Heart sounds: No murmur heard.    No friction rub. No gallop.  Pulmonary:     Effort: Pulmonary effort  is normal.     Breath sounds: Normal breath sounds.  Abdominal:     General: Bowel sounds are normal.     Palpations: Abdomen is soft.     Comments: Endorses some right lower quadrant tenderness but reports that it is not worse when I palpate the abdomen.  He has no right upper quadrant or other significant abdominal tenderness, abdomen is soft, nondistended, nonrigid.  No rebound or guarding throughout.   Skin:    General: Skin is warm and dry.     Capillary Refill: Capillary refill takes less than 2 seconds.   Neurological:     Mental Status: He is alert and oriented to person, place, and time.   Psychiatric:        Mood and Affect: Mood normal.        Behavior: Behavior normal.     (all labs ordered are listed, but only abnormal results are  displayed) Labs Reviewed  COMPREHENSIVE METABOLIC PANEL WITH GFR - Abnormal; Notable for the following components:      Result Value   Glucose, Bld 121 (*)    All other components within normal limits  CBC - Abnormal; Notable for the following components:   WBC 10.9 (*)    All other components within normal limits  URINALYSIS, ROUTINE W REFLEX MICROSCOPIC - Abnormal; Notable for the following components:   APPearance HAZY (*)    Hgb urine dipstick MODERATE (*)    Ketones, ur 5 (*)    Leukocytes,Ua SMALL (*)    All other components within normal limits  LIPASE, BLOOD    EKG: None  Radiology: CT ABDOMEN PELVIS W CONTRAST Result Date: 08/25/2023 EXAM: CT ABDOMEN AND PELVIS WITH CONTRAST 08/25/2023 01:13:16 AM TECHNIQUE: CT of the abdomen and pelvis was performed with the administration of intravenous contrast. Multiplanar reformatted images are provided for review. Automated exposure control, iterative reconstruction, and/or weight based adjustment of the mA/kV was utilized to reduce the radiation dose to as low as reasonably achievable. CONTRAST: 100mL iohexol (OMNIPAQUE) 300 MG/ML solution COMPARISON: None available. CLINICAL HISTORY: Abdominal pain, acute, nonlocalized. Abdominal pain x 1 month. Patient reports worsening RLQ abdominal pain tonight. Patient reports nausea and vomiting x 1 tonight, WBC's 10.9. FINDINGS: LOWER CHEST: No acute abnormality. LIVER: The liver is unremarkable. GALLBLADDER AND BILE DUCTS: Gallbladder is unremarkable. No biliary ductal dilatation. SPLEEN: No acute abnormality. PANCREAS: No acute abnormality. ADRENAL GLANDS: No acute abnormality. KIDNEYS, URETERS AND BLADDER: 16 mm calculus in the right renal pelvis (image 44) with associated mild right hydronephrosis and diminished/delayed enhancement of the right kidney. No stones in the left kidney or ureter. No left hydronephrosis. No perinephric or periureteral stranding. Urinary bladder is unremarkable. GI AND  BOWEL: Normal appendix (image 69). Stomach demonstrates no acute abnormality. There is no bowel obstruction. No bowel wall thickening. PERITONEUM AND RETROPERITONEUM: No ascites. No free air. VASCULATURE: Aorta is normal in caliber. LYMPH NODES: No lymphadenopathy. REPRODUCTIVE ORGANS: No acute abnormality. BONES AND SOFT TISSUES: Mild degenerative changes of the visualized thoracolumbar spine. No acute osseous abnormality. No focal soft tissue abnormality. IMPRESSION: 1. 16 mm calculus in the right renal pelvis with associated mild right hydronephrosis. Electronically signed by: Zadie Herter MD 08/25/2023 01:17 AM EDT RP Workstation: [id number]     Procedures   Medications Ordered in the ED  ondansetron [facility]) injection 4 mg (4 mg Intravenous Given 08/25/23 0038)  ketorolac (TORADOL) 15 MG/ML injection 15 mg (15 mg Intravenous Given 08/25/23 0039)  dicyclomine (BENTYL) capsule 10  mg (10 mg Oral Given 08/25/23 0038)  sodium chloride 0.9 % bolus 1,000 mL (1,000 mLs Intravenous New Bag/Given 08/25/23 0040)  hydrALAZINE (APRESOLINE) injection 10 mg (10 mg Intravenous Given 08/25/23 0123)  iohexol (OMNIPAQUE) 300 MG/ML solution 100 mL (100 mLs Intravenous Contrast Given 08/25/23 0059)                                    Medical Decision Making Amount and/or Complexity of Data Reviewed Labs: ordered.   This patient is a 33 y.o. male  who presents to the ED for concern of abdominal pain.   Differential diagnoses prior to evaluation: The emergent differential diagnosis includes, but is not limited to,  The causes of generalized abdominal pain include but are not limited to AAA, mesenteric ischemia, appendicitis, diverticulitis, DKA, gastritis, gastroenteritis, AMI, nephrolithiasis, pancreatitis, peritonitis, adrenal insufficiency,lead poisoning, iron toxicity, intestinal ischemia, constipation, UTI,SBO/LBO, splenic rupture, biliary disease, IBD, IBS, PUD, or hepatitis . This is not an exhaustive  differential.  Past Medical History / Co-morbidities / Social History: Obesity, otherwise overall unremarkable  Physical Exam: Physical exam performed. The pertinent findings include: Endorses some right lower quadrant tenderness but reports that it is not worse when I palpate the abdomen.  He has no right upper quadrant or other significant abdominal tenderness, abdomen is soft, nondistended, nonrigid.  No rebound or guarding throughout.   Lab Tests/Imaging studies: I personally interpreted labs/imaging and the pertinent results include: CBC with mild leukocytosis, otherwise unremarkable, normal lipase, CMP overall unremarkable other than mild hyperglycemia, glucose 121, his UA has moderate hemoglobin, there are small leukocytes but very few white blood cells and no bacteria, given that he is not having dysuria I do not think that this represents an infected urine, some leukocytes secondary likely to the renal colic, blood in urine.  I independently interpreted CT abdomen pelvis with contrast which shows 16 mm stone in the right renal pelvis with downstream hydroureter. I agree with the radiologist interpretation.  Medications: I ordered medication including hydralazine for blood pressure, Toradol, Bentyl, Zofran for nausea, abdominal pain, fluid bolus for suspected mild dehydration after 1 episode of emesis..  I have reviewed the patients home medicines and have made adjustments as needed.   Disposition: After consideration of the diagnostic results and the patients response to treatment, I feel that patient with large stone in the renal pelvis that is not likely to pass without intervention.  Appropriate for follow-up with urology given no acute infection today.  Pain controlled in the emergency department.Aaron Aas   emergency department workup does not suggest an emergent condition requiring admission or immediate intervention beyond what has been performed at this time. The plan is: as above. The  patient is safe for discharge and has been instructed to return immediately for worsening symptoms, change in symptoms or any other concerns.   Final diagnoses:  Nephrolithiasis  Hydroureter    ED Discharge Orders          Ordered    HYDROcodone-acetaminophen  (NORCO/VICODIN) 5-325 MG tablet  Every 6 hours PRN        08/25/23 0245    ketorolac (TORADOL) 10 MG tablet  Every 6 hours PRN        08/25/23 0245    ondansetron (ZOFRAN-ODT) 4 MG disintegrating tablet  Every 6 hours PRN        08/25/23 0245  Nelly Banco, PA-C 08/25/23 0246    Earma Gloss, MD 08/25/23 [phone]

## 2023-09-13 ENCOUNTER — Other Ambulatory Visit: Payer: Self-pay | Admitting: Urology

## 2023-09-20 NOTE — Patient Instructions (Signed)
 SURGICAL WAITING ROOM VISITATION Patients having surgery or a procedure may have no more than 2 support people in the waiting area - these visitors may rotate in the visitor waiting room.   If the patient needs to stay at the hospital during part of their recovery, the visitor guidelines for inpatient rooms apply.  PRE-OP VISITATION  Pre-op nurse will coordinate an appropriate time for 1 support person to accompany the patient in pre-op.  This support person may not rotate.  This visitor will be contacted when the time is appropriate for the visitor to come back in the pre-op area.  Please refer to the [facility] website for the visitor guidelines for Inpatients (after your surgery is over and you are in a regular room).  You are not required to quarantine at this time prior to your surgery. However, you must do this: Hand Hygiene often Do NOT share personal items Notify your provider if you are in close contact with someone who has COVID or you develop fever 100.4 or greater, new onset of sneezing, cough, sore throat, shortness of breath or body aches.  If you test positive for Covid or have been in contact with anyone that has tested positive in the last 10 days please notify you surgeon.    Your procedure is scheduled on:  MONDAY  September 27, 2023  Report to [facility] Main Entrance: [place] entrance where the [employer] is available.   Report to admitting at: 05:15  AM  Call this number if you have any questions or problems the morning of surgery [phone]  DO NOT EAT OR DRINK ANYTHING AFTER MIDNIGHT THE NIGHT PRIOR TO YOUR SURGERY / PROCEDURE.   FOLLOW  ANY ADDITIONAL PRE OP INSTRUCTIONS YOU RECEIVED FROM YOUR SURGEON'S OFFICE!!!   Oral Hygiene is also important to reduce your risk of infection.        Remember - BRUSH YOUR TEETH THE MORNING OF SURGERY WITH YOUR REGULAR TOOTHPASTE  Do NOT smoke after Midnight the night before surgery.  STOP TAKING all Vitamins,  Herbs and supplements 1 week before your surgery.   Take ONLY these medicines the morning of surgery with A SIP OF WATER: none                    You may not have any metal on your body including jewelry, and body piercing  Do not wear lotions, powders,  cologne, or deodorant  Men may shave face and neck.  Contacts, Hearing Aids, dentures or bridgework may not be worn into surgery. DENTURES WILL BE REMOVED PRIOR TO SURGERY PLEASE DO NOT APPLY Poly grip OR ADHESIVES!!!  Patients discharged on the day of surgery will not be allowed to drive home.  Someone NEEDS to stay with you for the first 24 hours after anesthesia.  Do not bring your home medications to the hospital. The Pharmacy will dispense medications listed on your medication list to you during your admission in the Hospital.  Please read over the following fact sheets you were given: IF YOU HAVE QUESTIONS ABOUT YOUR PRE-OP INSTRUCTIONS, PLEASE CALL [phone].   [place] - Preparing for Surgery Before surgery, you can play an important role.  Because skin is not sterile, your skin needs to be as free of germs as possible.  You can reduce the number of germs on your skin by washing with CHG (chlorahexidine gluconate) soap before surgery.  CHG is an antiseptic cleaner which kills germs and bonds with  the skin to continue killing germs even after washing. Please DO NOT use if you have an allergy to CHG or antibacterial soaps.  If your skin becomes reddened/irritated stop using the CHG and inform your nurse when you arrive at Short Stay. Do not shave (including legs and underarms) for at least 48 hours prior to the first CHG shower.  You may shave your face/neck.  Please follow these instructions carefully:  1.  Shower with CHG Soap the night before surgery and the  morning of surgery.  2.  If you choose to wash your hair, wash your hair first as usual with your normal  shampoo.  3.  After you shampoo, rinse your hair and body  thoroughly to remove the shampoo.                             4.  Use CHG as you would any other liquid soap.  You can apply chg directly to the skin and wash.  Gently with a scrungie or clean washcloth.  5.  Apply the CHG Soap to your body ONLY FROM THE NECK DOWN.   Do not use on face/ open                           Wound or open sores. Avoid contact with eyes, ears mouth and genitals (private parts).                       Wash face,  Genitals (private parts) with your normal soap.             6.  Wash thoroughly, paying special attention to the area where your  surgery  will be performed.  7.  Thoroughly rinse your body with warm water from the neck down.  8.  DO NOT shower/wash with your normal soap after using and rinsing off the CHG Soap.            9.  Pat yourself dry with a clean towel.            10.  Wear clean pajamas.            11.  Place clean sheets on your bed the night of your first shower and do not  sleep with pets.  ON THE DAY OF SURGERY : Do not apply any lotions/deodorants the morning of surgery.  Please wear clean clothes to the hospital/surgery center.    FAILURE TO FOLLOW THESE INSTRUCTIONS MAY RESULT IN THE CANCELLATION OF YOUR SURGERY  PATIENT SIGNATURE_________________________________  NURSE SIGNATURE__________________________________  ________________________________________________________________________

## 2023-09-20 NOTE — Progress Notes (Signed)
 COVID Vaccine received:  [x]  No []  Yes Date of any COVID positive Test in last 90 days:  none  PCP - none Cardiologist - none  Chest x-ray - 04-06-2021  2v  Epic EKG - 04-08-2021  Epic  will repeat  Stress Test -  ECHO -  Cardiac Cath -  CT Coronary Calcium score:   Bowel Prep - [x]  No  []   Yes ______  Pacemaker / ICD device [x]  No []  Yes   Spinal Cord Stimulator:[x]  No []  Yes       History of Sleep Apnea? []  No [x]  Yes  suspected but no study CPAP used?- [x]  No []  Yes    Does the patient monitor blood sugar?   [x]  N/A   []  No []  Yes  Patient has: [x]  NO Hx DM   []  Pre-DM   []  DM1  []   DM2  Blood Thinner / Instructions:  none Aspirin Instructions:  none  ERAS Protocol Ordered: [x]  No  []  Yes Patient is to be NPO after: MN Prior  Dental hx: []  Dentures:  []  N/A      []  Bridge or Partial:                   [x]  Loose or Damaged teeth:  1 tooth bottom left and 1 tooth bottom right   Comments: He says that he will have insurance in November and will get a PCP then. He just goes to [facility] places now if he has an acute problem.   Activity level: Able to walk up 2 flights of stairs without becoming significantly short of breath or having chest pain?  []  No   [x]    Yes   Anesthesia review: HTN- no meds (166 / 109 at PST, [facility] notified), has no PCP. No pertinent medical or surgical hx.  Patient has Dyslexia,   Patient denies any S&S of respiratory illness or Covid - no shortness of breath, fever, cough or chest pain at PAT appointment.  Patient verbalized understanding and agreement to the Pre-Surgical Instructions that were given to them at this PAT appointment. Patient was also educated of the need to review these PAT instructions again prior to his surgery.I reviewed the appropriate phone numbers to call if they have any and questions or concerns.

## 2023-09-21 ENCOUNTER — Encounter (HOSPITAL_COMMUNITY)
Admission: RE | Admit: 2023-09-21 | Discharge: 2023-09-21 | Disposition: A | Discharge status: Home / Self Care | Payer: Self-pay | Source: Ambulatory Visit | Attending: Urology | Admitting: Urology

## 2023-09-21 ENCOUNTER — Other Ambulatory Visit: Payer: Self-pay

## 2023-09-21 ENCOUNTER — Encounter (HOSPITAL_COMMUNITY): Payer: Self-pay

## 2023-09-21 VITALS — BP 166/109 | HR 92 | Temp 98.5°F | Resp 22 | Ht 75.5 in | Wt 350.0 lb

## 2023-09-21 DIAGNOSIS — R9431 Abnormal electrocardiogram [ECG] [EKG]: Secondary | ICD-10-CM | POA: Insufficient documentation

## 2023-09-21 DIAGNOSIS — I10 Essential (primary) hypertension: Secondary | ICD-10-CM | POA: Insufficient documentation

## 2023-09-21 DIAGNOSIS — Z01818 Encounter for other preprocedural examination: Secondary | ICD-10-CM | POA: Insufficient documentation

## 2023-09-21 HISTORY — DX: Other specified health status: Z78.9

## 2023-09-21 HISTORY — DX: Chronic kidney disease, unspecified: N18.9

## 2023-09-21 HISTORY — DX: Cardiac arrhythmia, unspecified: I49.9

## 2023-09-21 HISTORY — DX: Essential (primary) hypertension: I10

## 2023-09-21 HISTORY — DX: Personal history of urinary calculi: Z87.442

## 2023-09-21 HISTORY — DX: Unspecified osteoarthritis, unspecified site: M19.90

## 2023-09-21 LAB — CBC
HCT: 46.5 % (ref 39.0–52.0)
Hemoglobin: 15.2 g/dL (ref 13.0–17.0)
MCH: 28.2 pg (ref 26.0–34.0)
MCHC: 32.7 g/dL (ref 30.0–36.0)
MCV: 86.3 fL (ref 80.0–100.0)
Platelets: 277 K/uL (ref 150–400)
RBC: 5.39 MIL/uL (ref 4.22–5.81)
RDW: 12.8 % (ref 11.5–15.5)
WBC: 6.4 K/uL (ref 4.0–10.5)
nRBC: 0 % (ref 0.0–0.2)

## 2023-09-21 LAB — BASIC METABOLIC PANEL WITH GFR
Anion gap: 12 (ref 5–15)
BUN: 13 mg/dL (ref 6–20)
CO2: 24 mmol/L (ref 22–32)
Calcium: 9 mg/dL (ref 8.9–10.3)
Chloride: 104 mmol/L (ref 98–111)
Creatinine, Ser: 0.59 mg/dL — ABNORMAL LOW (ref 0.61–1.24)
GFR, Estimated: >60 mL/min (ref >60)
Glucose, Bld: 115 mg/dL — ABNORMAL HIGH (ref 70–99)
Potassium: 4.4 mmol/L (ref 3.5–5.1)
Sodium: 140 mmol/L (ref 135–145)

## 2023-09-21 NOTE — Progress Notes (Addendum)
   09/21/23 0913  OBSTRUCTIVE SLEEP APNEA  Have you ever been diagnosed with sleep apnea through a sleep study? No  Do you snore loudly (loud enough to be heard through closed doors)?  1  Do you often feel tired, fatigued, or sleepy during the daytime (such as falling asleep during driving or talking to someone)? 0  Has anyone observed you stop breathing during your sleep? 0  Do you have, or are you being treated for high blood pressure? 1  BMI more than 35 kg/m2? 1  Age > 50 (1-yes) 0  Neck circumference greater than:Male 16 inches or larger, Male 17inches or larger? 1  Male Gender (Yes=1) 1  Obstructive Sleep Apnea Score 5   STOP BANG 5 at PST appt. 09-21-23  Patient has no PCP, he just goes to the [facility] as needed.   Shawnee Aloe, BSN, CVRN-BC   Pre-Surgical Testing Nurse [facility]  [phone]

## 2023-09-22 NOTE — Progress Notes (Incomplete)
 Anesthesia Chart Review   Case: [id number] Date/Time: 09/27/23 0715   Procedure: CYSTOSCOPY/URETEROSCOPY/HOLMIUM LASER/STENT PLACEMENT (Right)   Anesthesia type: General   Diagnosis: Calculus of kidney [N20.0]   Pre-op diagnosis: RIGHT KIDNEY STONE   Location: [facility] PROCEDURE [facility] / [facility]   Surgeons: Carolee Sherwood JONETTA DOUGLAS, MD       DISCUSSION:33 y.o. never smoker with h/o HTN, CKD, right kidney stone scheduled for above procedure 09/27/2023 with Dr.   EDWINA: BP (!) 166/109 Comment: right arm sitting  Pulse 92   Temp 36.9 C (Oral)   Resp (!) 22   Ht 6' 3.5 (1.918 m)   Wt (!) 158.8 kg   SpO2 99%   BMI 43.17 kg/m   PROVIDERS: Pcp, No   LABS: {CHL AN LABS REVIEWED:112001::Labs reviewed: Acceptable for surgery.} (all labs ordered are listed, but only abnormal results are displayed)  Labs Reviewed  BASIC METABOLIC PANEL WITH GFR - Abnormal; Notable for the following components:      Result Value   Glucose, Bld 115 (*)    Creatinine, Ser 0.59 (*)    All other components within normal limits  CBC     IMAGES:   EKG:   CV:  Past Medical History:  Diagnosis Date   Arthritis    in hands   Chronic kidney disease    Dysrhythmia    tachycardia   History of kidney stones    Hypertension    No meds   Medical history non-contributory     Past Surgical History:  Procedure Laterality Date   NO PAST SURGERIES      MEDICATIONS:  naproxen  (EC NAPROSYN ) 500 MG EC tablet   No current facility-administered medications for this encounter.

## 2023-09-26 NOTE — Anesthesia Preprocedure Evaluation (Signed)
 Anesthesia Evaluation  Patient identified by MRN, date of birth, ID band Patient awake    Reviewed: Allergy & Precautions, NPO status , Patient's Chart, lab work & pertinent test results  Airway Mallampati: I  TM Distance: >3 FB Neck ROM: Full    Dental  (+) Dental Advisory Given, Chipped,    Pulmonary neg pulmonary ROS   Pulmonary exam normal breath sounds clear to auscultation       Cardiovascular hypertension, Normal cardiovascular exam Rhythm:Regular Rate:Normal     Neuro/Psych negative neurological ROS  negative psych ROS   GI/Hepatic negative GI ROS, Neg liver ROS,,,  Endo/Other    Class 3 obesity (BMI 43)  Renal/GU Renal InsufficiencyRenal disease  negative genitourinary   Musculoskeletal  (+) Arthritis ,    Abdominal   Peds  Hematology negative hematology ROS (+)   Anesthesia Other Findings   Reproductive/Obstetrics                              Anesthesia Physical Anesthesia Plan  ASA: 3  Anesthesia Plan: General   Post-op Pain Management: Tylenol  PO (pre-op)*   Induction: Intravenous  PONV Risk Score and Plan: 2 and Ondansetron , Dexamethasone  and Midazolam   Airway Management Planned: LMA  Additional Equipment:   Intra-op Plan:   Post-operative Plan: Extubation in OR  Informed Consent: I have reviewed the patients History and Physical, chart, labs and discussed the procedure including the risks, benefits and alternatives for the proposed anesthesia with the patient or authorized representative who has indicated his/her understanding and acceptance.     Dental advisory given  Plan Discussed with: CRNA  Anesthesia Plan Comments:          Anesthesia Quick Evaluation

## 2023-09-27 ENCOUNTER — Ambulatory Visit (HOSPITAL_COMMUNITY): Payer: Self-pay | Admitting: Medical

## 2023-09-27 ENCOUNTER — Encounter (HOSPITAL_COMMUNITY): Payer: Self-pay | Admitting: Urology

## 2023-09-27 ENCOUNTER — Other Ambulatory Visit: Payer: Self-pay | Admitting: Urology

## 2023-09-27 ENCOUNTER — Ambulatory Visit (HOSPITAL_BASED_OUTPATIENT_CLINIC_OR_DEPARTMENT_OTHER): Payer: Self-pay | Admitting: Anesthesiology

## 2023-09-27 ENCOUNTER — Ambulatory Visit (HOSPITAL_COMMUNITY): Payer: Self-pay

## 2023-09-27 ENCOUNTER — Encounter (HOSPITAL_COMMUNITY)
Admission: RE | Disposition: A | Discharge status: Home / Self Care | Payer: Self-pay | Source: Ambulatory Visit | Attending: Urology

## 2023-09-27 ENCOUNTER — Ambulatory Visit (HOSPITAL_COMMUNITY)
Admission: RE | Admit: 2023-09-27 | Discharge: 2023-09-27 | Disposition: A | Discharge status: Home / Self Care | Payer: Self-pay | Source: Ambulatory Visit | Attending: Urology | Admitting: Urology

## 2023-09-27 ENCOUNTER — Other Ambulatory Visit: Payer: Self-pay

## 2023-09-27 DIAGNOSIS — I10 Essential (primary) hypertension: Secondary | ICD-10-CM

## 2023-09-27 DIAGNOSIS — N189 Chronic kidney disease, unspecified: Secondary | ICD-10-CM | POA: Insufficient documentation

## 2023-09-27 DIAGNOSIS — Z6841 Body Mass Index (BMI) 40.0 and over, adult: Secondary | ICD-10-CM | POA: Insufficient documentation

## 2023-09-27 DIAGNOSIS — N200 Calculus of kidney: Secondary | ICD-10-CM | POA: Insufficient documentation

## 2023-09-27 DIAGNOSIS — E66813 Obesity, class 3: Secondary | ICD-10-CM

## 2023-09-27 DIAGNOSIS — I129 Hypertensive chronic kidney disease with stage 1 through stage 4 chronic kidney disease, or unspecified chronic kidney disease: Secondary | ICD-10-CM | POA: Insufficient documentation

## 2023-09-27 HISTORY — PX: CYSTOSCOPY/URETEROSCOPY/HOLMIUM LASER/STENT PLACEMENT: SHX6546

## 2023-09-27 SURGERY — CYSTOSCOPY/URETEROSCOPY/HOLMIUM LASER/STENT PLACEMENT
Anesthesia: General | Site: Pelvis | Laterality: Right

## 2023-09-27 MED ORDER — SODIUM CHLORIDE (PF) 0.9 % IJ SOLN
INTRAMUSCULAR | Status: AC
Start: 2023-09-27 — End: 2023-09-27
  Filled 2023-09-27: qty 10

## 2023-09-27 MED ORDER — PROPOFOL 10 MG/ML IV BOLUS
INTRAVENOUS | Status: AC
  Filled 2023-09-27: qty 20

## 2023-09-27 MED ORDER — TAMSULOSIN HCL 0.4 MG PO CAPS
0.4000 mg | ORAL_CAPSULE | Freq: Every day | ORAL | 1 refills | Status: AC
Start: 2023-09-27 — End: ?

## 2023-09-27 MED ORDER — DEXAMETHASONE SODIUM PHOSPHATE 10 MG/ML IJ SOLN
INTRAMUSCULAR | Status: DC | PRN
  Administered 2023-09-27: 10 mg via INTRAVENOUS

## 2023-09-27 MED ORDER — MIDAZOLAM HCL 2 MG/2ML IJ SOLN
INTRAMUSCULAR | Status: DC | PRN
  Administered 2023-09-27: 2 mg via INTRAVENOUS

## 2023-09-27 MED ORDER — LIDOCAINE HCL (PF) 2 % IJ SOLN
INTRAMUSCULAR | Status: AC
  Filled 2023-09-27: qty 5

## 2023-09-27 MED ORDER — MIDAZOLAM HCL 2 MG/2ML IJ SOLN
INTRAMUSCULAR | Status: AC
  Filled 2023-09-27: qty 2

## 2023-09-27 MED ORDER — LIDOCAINE HCL (CARDIAC) PF 100 MG/5ML IV SOSY
PREFILLED_SYRINGE | INTRAVENOUS | Status: DC | PRN
  Administered 2023-09-27: 100 mg via INTRAVENOUS

## 2023-09-27 MED ORDER — FENTANYL CITRATE PF 50 MCG/ML IJ SOSY: 25.0000 ug | PREFILLED_SYRINGE | INTRAMUSCULAR | Status: DC | PRN

## 2023-09-27 MED ORDER — HYDROCODONE-ACETAMINOPHEN 5-325 MG PO TABS
1.0000 | ORAL_TABLET | Freq: Four times a day (QID) | ORAL | 0 refills | Status: DC | PRN
Start: 2023-09-27 — End: 2023-10-14

## 2023-09-27 MED ORDER — ORAL CARE MOUTH RINSE: 15.0000 mL | Freq: Once | OROMUCOSAL | Status: AC

## 2023-09-27 MED ORDER — CHLORHEXIDINE GLUCONATE 0.12 % MT SOLN
15.0000 mL | Freq: Once | OROMUCOSAL | Status: AC
  Administered 2023-09-27: 15 mL via OROMUCOSAL

## 2023-09-27 MED ORDER — KETOROLAC TROMETHAMINE 30 MG/ML IJ SOLN
INTRAMUSCULAR | Status: DC | PRN
Start: 2023-09-27 — End: 2023-09-27
  Administered 2023-09-27: 30 mg via INTRAVENOUS

## 2023-09-27 MED ORDER — AMISULPRIDE (ANTIEMETIC) 5 MG/2ML IV SOLN: 10.0000 mg | Freq: Once | INTRAVENOUS | Status: DC | PRN

## 2023-09-27 MED ORDER — KETOROLAC TROMETHAMINE 30 MG/ML IJ SOLN
INTRAMUSCULAR | Status: AC
Start: 2023-09-27 — End: 2023-09-27
  Filled 2023-09-27: qty 1

## 2023-09-27 MED ORDER — CEFAZOLIN SODIUM 1 G IJ SOLR
INTRAMUSCULAR | Status: AC
  Filled 2023-09-27: qty 10

## 2023-09-27 MED ORDER — FENTANYL CITRATE (PF) 100 MCG/2ML IJ SOLN
INTRAMUSCULAR | Status: AC
  Filled 2023-09-27: qty 2

## 2023-09-27 MED ORDER — OXYCODONE HCL 5 MG/5ML PO SOLN: 5.0000 mg | Freq: Once | ORAL | Status: DC | PRN

## 2023-09-27 MED ORDER — ONDANSETRON HCL 4 MG/2ML IJ SOLN
INTRAMUSCULAR | Status: AC
  Filled 2023-09-27: qty 2

## 2023-09-27 MED ORDER — PROPOFOL 500 MG/50ML IV EMUL
INTRAVENOUS | Status: AC
  Filled 2023-09-27: qty 50

## 2023-09-27 MED ORDER — PROPOFOL 10 MG/ML IV BOLUS
INTRAVENOUS | Status: DC | PRN
  Administered 2023-09-27: 300 mg via INTRAVENOUS

## 2023-09-27 MED ORDER — CEFAZOLIN SODIUM-DEXTROSE 2-4 GM/100ML-% IV SOLN
2.0000 g | INTRAVENOUS | Status: AC
  Administered 2023-09-27: 3 g via INTRAVENOUS
  Filled 2023-09-27: qty 100

## 2023-09-27 MED ORDER — DEXAMETHASONE SODIUM PHOSPHATE 10 MG/ML IJ SOLN
INTRAMUSCULAR | Status: AC
  Filled 2023-09-27: qty 1

## 2023-09-27 MED ORDER — LACTATED RINGERS IV SOLN: INTRAVENOUS | Status: DC

## 2023-09-27 MED ORDER — SODIUM CHLORIDE 0.9 % IR SOLN
Status: DC | PRN
  Administered 2023-09-27: 3000 mL via INTRAVESICAL

## 2023-09-27 MED ORDER — IOHEXOL 300 MG/ML  SOLN: INTRAMUSCULAR | Status: DC | PRN

## 2023-09-27 MED ORDER — ACETAMINOPHEN 500 MG PO TABS
1000.0000 mg | ORAL_TABLET | Freq: Once | ORAL | Status: AC
  Administered 2023-09-27: 1000 mg via ORAL
  Filled 2023-09-27: qty 2

## 2023-09-27 MED ORDER — FENTANYL CITRATE (PF) 100 MCG/2ML IJ SOLN
INTRAMUSCULAR | Status: DC | PRN
  Administered 2023-09-27 (×2): 25 ug via INTRAVENOUS

## 2023-09-27 MED ORDER — OXYCODONE HCL 5 MG PO TABS: 5.0000 mg | ORAL_TABLET | Freq: Once | ORAL | Status: DC | PRN

## 2023-09-27 SURGICAL SUPPLY — 20 items
BAG URO CATCHER STRL LF (MISCELLANEOUS) ×1 IMPLANT
BASKET LASER NITINOL 1.9FR (BASKET) IMPLANT
BASKET ZERO TIP NITINOL 2.4FR (BASKET) IMPLANT
CATH URETERAL DUAL LUMEN 10F (MISCELLANEOUS) IMPLANT
CATH URETL OPEN END 6FR 70 (CATHETERS) ×1 IMPLANT
CLOTH BEACON ORANGE TIMEOUT ST (SAFETY) ×1 IMPLANT
GLOVE BIO SURGEON STRL SZ7.5 (GLOVE) ×1 IMPLANT
GOWN STRL REUS W/ TWL XL LVL3 (GOWN DISPOSABLE) ×1 IMPLANT
GUIDEWIRE ANG ZIPWIRE 038X150 (WIRE) IMPLANT
GUIDEWIRE STR DUAL SENSOR (WIRE) ×1 IMPLANT
KIT TURNOVER KIT A (KITS) ×1 IMPLANT
MANIFOLD NEPTUNE II (INSTRUMENTS) ×1 IMPLANT
PACK CYSTO (CUSTOM PROCEDURE TRAY) ×1 IMPLANT
SHEATH NAVIGATOR HD 11/13X28 (SHEATH) IMPLANT
SHEATH NAVIGATOR HD 11/13X36 (SHEATH) IMPLANT
SHEATH NAVIGATOR HD 12/14X46 (SHEATH) IMPLANT
STENT URET 6FRX26 CONTOUR (STENTS) IMPLANT
TRACTIP FLEXIVA PULS ID 200XHI (Laser) IMPLANT
TUBING CONNECTING 10 (TUBING) ×1 IMPLANT
TUBING UROLOGY SET (TUBING) ×1 IMPLANT

## 2023-09-27 NOTE — H&P (Signed)
 H&P  Chief Complaint: right renal calculus  History of Present Illness: 33 YO M with right ureteral calculus presents for right ureteroscopy with laser lithotripsy and stent placement   Past Medical History:  Diagnosis Date   Arthritis    in hands   Chronic kidney disease    Dysrhythmia    tachycardia   History of kidney stones    Hypertension    No meds   Medical history non-contributory    Past Surgical History:  Procedure Laterality Date   NO PAST SURGERIES      Home Medications:  Medications Prior to Admission  Medication Sig Dispense Refill Last Dose/Taking   naproxen  (EC NAPROSYN ) 500 MG EC tablet Take 500 mg by mouth every 12 (twelve) hours as needed (pain.).   09/17/2023   Allergies:  Allergies  Allergen Reactions   Beef-Derived Drug Products     OVOVEGETARIAN     Family History  Problem Relation Age of Onset   Heart failure Father    Hypertension Father    Social History:  reports that he has never smoked. He has never been exposed to tobacco smoke. He has never used smokeless tobacco. He reports that he does not currently use alcohol. He reports that he does not currently use drugs.  ROS: A complete review of systems was performed.  All systems are negative except for pertinent findings as noted. ROS   Physical Exam:  Vital signs in last 24 hours: Temp:  [98.6 F (37 C)] 98.6 F (37 C) (07/21 0543) Pulse Rate:  [90] 90 (07/21 0543) Resp:  [21] 21 (07/21 0543) BP: (166-173)/(108-111) 166/111 (07/21 0549) SpO2:  [97 %] 97 % (07/21 0543) Weight:  [158.8 kg] 158.8 kg (07/21 0627) General:  Alert and oriented, No acute distress HEENT: Normocephalic, atraumatic Neck: No JVD or lymphadenopathy Cardiovascular: Regular rate and rhythm Lungs: Regular rate and effort Abdomen: Soft, nontender, nondistended, no abdominal masses Back: No CVA tenderness Extremities: No edema Neurologic: Grossly intact  Laboratory Data:  No results found for this or any  previous visit (from the past 24 hours). No results found for this or any previous visit (from the past 240 hours). Creatinine: Recent Labs    09/21/23 0937  CREATININE 0.59*    Impression/Assessment:  Right ureteral stone  Plan:  Proceed with right ureteroscopy with lasre lithotripsy and stent placement. R/b discussed  Sherwood JONETTA Edison, III 09/27/2023, 7:28 AM

## 2023-09-27 NOTE — Discharge Instructions (Signed)

## 2023-09-27 NOTE — Op Note (Signed)
 Operative Note  Preoperative diagnosis:  1.  Right renal calculus  Postoperative diagnosis: 1.  Right renal calculus  Procedure(s): 1.  Cystoscopy with right ureteral stent placement  Surgeon: Sherwood Edison, MD  Assistants: None  Anesthesia: General  Complications: None immediate  EBL: Minimal  Specimens: 1.  None  Drains/Catheters: 1.  6 x 26 double-J ureteral stent  Intraoperative findings: 1.  Normal urethra and bladder 2.  Right ureter was too tight for advancement of ureteral access sheath and therefore stent was placed  Indication: 33 year old male with right renal pelvic calculus presents for.  Symptoms from operation correct  Description of procedure:  The patient was identified and consent was obtained.  The patient was taken to the operating room and placed in the supine position.  The patient was placed under general anesthesia.  Perioperative antibiotics were administered.  The patient was placed in dorsal lithotomy.  Patient was prepped and draped in a standard sterile fashion and a timeout was performed.  A 21 French rigid scope was maintained through urethra and into the bladder.  Complete cystoscopy was performed with findings noted above.  Right ureter was cannulated with a sensor wire which was passed up to the kidney under fluoroscopic guidance.  A second wire was advanced alongside this and up into the kidney.  I attempted to pass the inner portion of the access sheath over the wire and this met resistance at the distal ureter.  I tried to pass the entire access sheath and this also met some resistance and so access to the ureter was aborted.  I withdrew on the wires.  I backloaded the other wire onto rigid cystoscope and a bedside with the bladder followed by routine placement of a 6 x 26 double-J ureteral stent.  Fluoroscopy confirmed proximal placement and direct visualization confirmed a good coil within the bladder.  Trying to bladder withdrew the scope.   Patient tolerated procedure well was stable postoperatively.  Plan: Follow-up in 1-2 weeks for restage ureteroscopy

## 2023-09-27 NOTE — Anesthesia Procedure Notes (Signed)
 Procedure Name: LMA Insertion Date/Time: 09/27/2023 7:48 AM  Performed by: Therisa Doyal CROME, CRNAPatient Re-evaluated:Patient Re-evaluated prior to induction Oxygen Delivery Method: Circle system utilized Preoxygenation: Pre-oxygenation with 100% oxygen Induction Type: IV induction LMA Size: 5.0 Number of attempts: 1 Placement Confirmation: positive ETCO2 and breath sounds checked- equal and bilateral Tube secured with: Tape Dental Injury: Teeth and Oropharynx as per pre-operative assessment

## 2023-09-27 NOTE — Transfer of Care (Signed)
 Immediate Anesthesia Transfer of Care Note  Patient: Vincent Doyle  Procedure(s) Performed: CYSTOSCOPY/URETEROSCOPY/HOLMIUM LASER/STENT PLACEMENT (Right: Pelvis)  Patient Location: PACU  Anesthesia Type:General  Level of Consciousness: awake, alert , and oriented  Airway & Oxygen Therapy: Patient Spontanous Breathing and Patient connected to face mask oxygen  Post-op Assessment: Report given to RN and Post -op Vital signs reviewed and stable  Post vital signs: Reviewed and stable  Last Vitals:  Vitals Value Taken Time  BP 162/113 09/27/23 08:23  Temp    Pulse 86 09/27/23 08:25  Resp 15 09/27/23 08:25  SpO2 97 % 09/27/23 08:25  Vitals shown include unfiled device data.  Last Pain:  Vitals:   09/27/23 9367  TempSrc:   PainSc: 0-No pain      Patients Stated Pain Goal: 3 (09/27/23 9372)  Complications: No notable events documented.

## 2023-09-27 NOTE — H&P (View-Only) (Signed)
 H&P  Chief Complaint: right renal calculus  History of Present Illness: 33 YO M with right ureteral calculus presents for right ureteroscopy with laser lithotripsy and stent placement   Past Medical History:  Diagnosis Date   Arthritis    in hands   Chronic kidney disease    Dysrhythmia    tachycardia   History of kidney stones    Hypertension    No meds   Medical history non-contributory    Past Surgical History:  Procedure Laterality Date   NO PAST SURGERIES      Home Medications:  Medications Prior to Admission  Medication Sig Dispense Refill Last Dose/Taking   naproxen  (EC NAPROSYN ) 500 MG EC tablet Take 500 mg by mouth every 12 (twelve) hours as needed (pain.).   09/17/2023   Allergies:  Allergies  Allergen Reactions   Beef-Derived Drug Products     OVOVEGETARIAN     Family History  Problem Relation Age of Onset   Heart failure Father    Hypertension Father    Social History:  reports that he has never smoked. He has never been exposed to tobacco smoke. He has never used smokeless tobacco. He reports that he does not currently use alcohol. He reports that he does not currently use drugs.  ROS: A complete review of systems was performed.  All systems are negative except for pertinent findings as noted. ROS   Physical Exam:  Vital signs in last 24 hours: Temp:  [98.6 F (37 C)] 98.6 F (37 C) (07/21 0543) Pulse Rate:  [90] 90 (07/21 0543) Resp:  [21] 21 (07/21 0543) BP: (166-173)/(108-111) 166/111 (07/21 0549) SpO2:  [97 %] 97 % (07/21 0543) Weight:  [158.8 kg] 158.8 kg (07/21 0627) General:  Alert and oriented, No acute distress HEENT: Normocephalic, atraumatic Neck: No JVD or lymphadenopathy Cardiovascular: Regular rate and rhythm Lungs: Regular rate and effort Abdomen: Soft, nontender, nondistended, no abdominal masses Back: No CVA tenderness Extremities: No edema Neurologic: Grossly intact  Laboratory Data:  No results found for this or any  previous visit (from the past 24 hours). No results found for this or any previous visit (from the past 240 hours). Creatinine: Recent Labs    09/21/23 0937  CREATININE 0.59*    Impression/Assessment:  Right ureteral stone  Plan:  Proceed with right ureteroscopy with lasre lithotripsy and stent placement. R/b discussed  Sherwood JONETTA Edison, III 09/27/2023, 7:28 AM

## 2023-09-27 NOTE — Anesthesia Postprocedure Evaluation (Signed)
 Anesthesia Post Note  Patient: Vincent Doyle  Procedure(s) Performed: CYSTOSCOPY/URETEROSCOPY/HOLMIUM LASER/STENT PLACEMENT (Right: Pelvis)     Patient location during evaluation: PACU Anesthesia Type: General Level of consciousness: awake and alert Pain management: pain level controlled Vital Signs Assessment: post-procedure vital signs reviewed and stable Respiratory status: spontaneous breathing, nonlabored ventilation, respiratory function stable and patient connected to nasal cannula oxygen Cardiovascular status: blood pressure returned to baseline and stable Postop Assessment: no apparent nausea or vomiting Anesthetic complications: no   No notable events documented.  Last Vitals:  Vitals:   09/27/23 0830 09/27/23 0845  BP: (!) 160/114 (!) 153/113  Pulse: 88 77  Resp: 14 12  Temp:    SpO2: 93% 97%    Last Pain:  Vitals:   09/27/23 0845  TempSrc:   PainSc: 0-No pain                 Citlally Captain L Khalani Novoa

## 2023-09-28 ENCOUNTER — Encounter (HOSPITAL_COMMUNITY): Payer: Self-pay | Admitting: Urology

## 2023-09-29 ENCOUNTER — Emergency Department (HOSPITAL_COMMUNITY)
Admission: EM | Admit: 2023-09-29 | Discharge: 2023-09-30 | Disposition: A | Discharge status: Home / Self Care | Payer: Self-pay | Attending: Emergency Medicine | Admitting: Emergency Medicine

## 2023-09-29 ENCOUNTER — Other Ambulatory Visit: Payer: Self-pay

## 2023-09-29 DIAGNOSIS — N189 Chronic kidney disease, unspecified: Secondary | ICD-10-CM | POA: Insufficient documentation

## 2023-09-29 DIAGNOSIS — R319 Hematuria, unspecified: Secondary | ICD-10-CM

## 2023-09-29 DIAGNOSIS — T8384XA Pain from genitourinary prosthetic devices, implants and grafts, initial encounter: Secondary | ICD-10-CM | POA: Insufficient documentation

## 2023-09-29 DIAGNOSIS — I129 Hypertensive chronic kidney disease with stage 1 through stage 4 chronic kidney disease, or unspecified chronic kidney disease: Secondary | ICD-10-CM | POA: Insufficient documentation

## 2023-09-29 DIAGNOSIS — R310 Gross hematuria: Secondary | ICD-10-CM | POA: Insufficient documentation

## 2023-09-29 HISTORY — DX: Hematuria, unspecified: R31.9

## 2023-09-29 LAB — URINALYSIS, ROUTINE W REFLEX MICROSCOPIC
Bilirubin Urine: NEGATIVE
Glucose, UA: NEGATIVE mg/dL
Ketones, ur: NEGATIVE mg/dL
Nitrite: NEGATIVE
Protein, ur: 100 mg/dL — AB
RBC / HPF: >50 RBC/hpf (ref 0–5)
Specific Gravity, Urine: 1.017 (ref 1.005–1.030)
pH: 6 (ref 5.0–8.0)

## 2023-09-29 NOTE — ED Triage Notes (Signed)
 Patient had a stent placed Monday morning for a 16mm kidney stone patient stated that he is now experiencing blood in his urine. Patient also c/o abdominal discomfort frequent urge to urinate slight front burning sensation around his abdominal area. No other complaints at this time.

## 2023-09-30 ENCOUNTER — Encounter (HOSPITAL_COMMUNITY): Payer: Self-pay | Admitting: Urology

## 2023-09-30 ENCOUNTER — Emergency Department (HOSPITAL_COMMUNITY): Payer: Self-pay

## 2023-09-30 LAB — CBC WITH DIFFERENTIAL/PLATELET
Abs Immature Granulocytes: 0.03 K/uL (ref 0.00–0.07)
Basophils Absolute: 0.1 K/uL (ref 0.0–0.1)
Basophils Relative: 1 %
Eosinophils Absolute: 0.2 K/uL (ref 0.0–0.5)
Eosinophils Relative: 2 %
HCT: 45.2 % (ref 39.0–52.0)
Hemoglobin: 14.6 g/dL (ref 13.0–17.0)
Immature Granulocytes: 0 %
Lymphocytes Relative: 25 %
Lymphs Abs: 2.3 K/uL (ref 0.7–4.0)
MCH: 28.2 pg (ref 26.0–34.0)
MCHC: 32.3 g/dL (ref 30.0–36.0)
MCV: 87.3 fL (ref 80.0–100.0)
Monocytes Absolute: 0.7 K/uL (ref 0.1–1.0)
Monocytes Relative: 8 %
Neutro Abs: 5.9 K/uL (ref 1.7–7.7)
Neutrophils Relative %: 64 %
Platelets: 289 K/uL (ref 150–400)
RBC: 5.18 MIL/uL (ref 4.22–5.81)
RDW: 13.2 % (ref 11.5–15.5)
WBC: 9.2 K/uL (ref 4.0–10.5)
nRBC: 0 % (ref 0.0–0.2)

## 2023-09-30 LAB — BASIC METABOLIC PANEL WITH GFR
Anion gap: 10 (ref 5–15)
BUN: 21 mg/dL — ABNORMAL HIGH (ref 6–20)
CO2: 29 mmol/L (ref 22–32)
Calcium: 9.1 mg/dL (ref 8.9–10.3)
Chloride: 102 mmol/L (ref 98–111)
Creatinine, Ser: 0.77 mg/dL (ref 0.61–1.24)
GFR, Estimated: >60 mL/min (ref >60)
Glucose, Bld: 102 mg/dL — ABNORMAL HIGH (ref 70–99)
Potassium: 4 mmol/L (ref 3.5–5.1)
Sodium: 141 mmol/L (ref 135–145)

## 2023-09-30 MED ORDER — HYOSCYAMINE SULFATE 0.125 MG PO TABS
0.1250 mg | ORAL_TABLET | Freq: Once | ORAL | Status: DC
  Filled 2023-09-30: qty 1

## 2023-09-30 MED ORDER — HYOSCYAMINE SULFATE 0.125 MG PO TBDP
0.1250 mg | ORAL_TABLET | Freq: Once | ORAL | Status: DC
  Filled 2023-09-30 (×2): qty 1

## 2023-09-30 MED ORDER — KETOROLAC TROMETHAMINE 15 MG/ML IJ SOLN
15.0000 mg | Freq: Once | INTRAMUSCULAR | Status: AC
Start: 2023-09-30 — End: 2023-09-30
  Administered 2023-09-30: 15 mg via INTRAMUSCULAR
  Filled 2023-09-30: qty 1

## 2023-09-30 MED ORDER — HYOSCYAMINE SULFATE 0.125 MG PO TBDP: 0.1250 mg | ORAL_TABLET | Freq: Four times a day (QID) | ORAL | 0 refills | Status: AC | PRN

## 2023-09-30 NOTE — ED Notes (Signed)
 Patient transported to CT

## 2023-09-30 NOTE — ED Notes (Signed)
 Patient reported passing clots in his urine at this time.

## 2023-09-30 NOTE — ED Provider Notes (Signed)
 [place] EMERGENCY DEPARTMENT AT [facility] Provider Note   CSN: [id number] Arrival date & time: 09/29/23  2243     Patient presents with: Hematuria   Vincent Doyle is a 33 y.o. male.   33 year old male presents today for concern of gross hematuria.  He recently had ureteral stent placed on Monday.  He states over the past 24 hours he has noticed gross hematuria.  He has had some pain since the time of the surgery however he states that he expected to be in some amount of discomfort.  He was just concerned about the bleeding he noticed.  This was placed for a 16mm kidney stone.  The history is provided by the patient. No language interpreter was used.       Prior to Admission medications   Medication Sig Start Date End Date Taking? Authorizing Provider  hyoscyamine  (ANASPAZ ) 0.125 MG TBDP disintergrating tablet Place 1 tablet (0.125 mg total) under the tongue every 6 (six) hours as needed for bladder spasms. 09/30/23  Yes Sahil Milner, PA-C  HYDROcodone -acetaminophen  (NORCO/VICODIN) 5-325 MG tablet Take 1 tablet by mouth every 6 (six) hours as needed. 09/27/23   Carolee Sherwood JONETTA DOUGLAS, MD  naproxen  (EC NAPROSYN ) 500 MG EC tablet Take 500 mg by mouth every 12 (twelve) hours as needed (pain.).    [provider]  tamsulosin  (FLOMAX ) 0.4 MG CAPS capsule Take 1 capsule (0.4 mg total) by mouth daily. 09/27/23   Carolee Sherwood JONETTA DOUGLAS, MD    Allergies: Beef-derived drug products    Review of Systems  Constitutional:  Negative for chills and fever.  Genitourinary:  Positive for flank pain and hematuria. Negative for dysuria.  Neurological:  Negative for light-headedness.  All other systems reviewed and are negative.   Updated Vital Signs BP (!) 156/113   Pulse 75   Temp 98.3 F (36.8 C)   Resp 16   SpO2 95%   Physical Exam Vitals and nursing note reviewed.  Constitutional:      General: He is not in acute distress.    Appearance: Normal appearance. He is not  ill-appearing.  HENT:     Head: Normocephalic and atraumatic.     Nose: Nose normal.  Eyes:     Conjunctiva/sclera: Conjunctivae normal.  Cardiovascular:     Rate and Rhythm: Normal rate and regular rhythm.  Pulmonary:     Effort: Pulmonary effort is normal. No respiratory distress.  Abdominal:     Palpations: Abdomen is soft.     Tenderness: There is no right CVA tenderness or left CVA tenderness.  Musculoskeletal:        General: No deformity. Normal range of motion.     Cervical back: Normal range of motion.  Skin:    Findings: No rash.  Neurological:     Mental Status: He is alert.     (all labs ordered are listed, but only abnormal results are displayed) Labs Reviewed  URINALYSIS, ROUTINE W REFLEX MICROSCOPIC - Abnormal; Notable for the following components:      Result Value   Color, Urine RED (*)    APPearance CLOUDY (*)    Hgb urine dipstick LARGE (*)    Protein, ur 100 (*)    Leukocytes,Ua SMALL (*)    Bacteria, UA FEW (*)    All other components within normal limits  BASIC METABOLIC PANEL WITH GFR - Abnormal; Notable for the following components:   Glucose, Bld 102 (*)    BUN 21 (*)  All other components within normal limits  CBC WITH DIFFERENTIAL/PLATELET    EKG: None  Radiology: CT Renal Stone Study Result Date: 09/30/2023 CLINICAL DATA:  Abdominal and flank pain. Hematuria. Status post ureteral stent placement Monday. EXAM: CT ABDOMEN AND PELVIS WITHOUT CONTRAST TECHNIQUE: Multidetector CT imaging of the abdomen and pelvis was performed following the standard protocol without IV contrast. RADIATION DOSE REDUCTION: This exam was performed according to the departmental dose-optimization program which includes automated exposure control, adjustment of the mA and/or kV according to patient size and/or use of iterative reconstruction technique. COMPARISON:  08/25/2023 FINDINGS: Lower chest: No acute findings. Hepatobiliary: No suspicious focal abnormality in  the liver on this study without intravenous contrast. There is no evidence for gallstones, gallbladder wall thickening, or pericholecystic fluid. No intrahepatic or extrahepatic biliary dilation. Pancreas: No focal mass lesion. No dilatation of the main duct. No intraparenchymal cyst. No peripancreatic edema. Spleen: No splenomegaly. No suspicious focal mass lesion. Adrenals/Urinary Tract: No adrenal nodule or mass. 13 x 9 x 13 mm stone identified in the right renal pelvis. Right internal ureteral stent device is new in the interval. Proximal tip of the stent is positioned in a lower pole calyx without a formed loop. The distal loop is formed in the bladder lumen. Left kidney and ureter unremarkable. Bladder Stomach/Bowel: Tiny hiatal hernia. Stomach otherwise unremarkable. Duodenum is normally positioned as is the ligament of Treitz. No small bowel wall thickening. No small bowel dilatation. The terminal ileum is normal. The appendix is normal. No gross colonic mass. No colonic wall thickening. Vascular/Lymphatic: No abdominal aortic aneurysm. No abdominal aortic atherosclerotic calcification. There is no gastrohepatic or hepatoduodenal ligament lymphadenopathy. No retroperitoneal or mesenteric lymphadenopathy. No pelvic sidewall lymphadenopathy. Reproductive: The prostate gland and seminal vesicles are unremarkable. Other: No intraperitoneal free fluid. Musculoskeletal: No worrisome lytic or sclerotic osseous abnormality. IMPRESSION: 1. 13 x 9 x 13 mm stone in the right renal pelvis. Right internal ureteral stent device is new in the interval. Proximal tip of the stent is positioned in a lower pole calyx without a formed loop. The distal loop is formed in the bladder lumen. There is some persistent fullness of the right intrarenal collecting system but this has decreased in the interval since prior study. 2. Tiny hiatal hernia. Electronically Signed   By: Camellia Candle M.D.   On: 09/30/2023 07:53      Procedures   Medications Ordered in the ED  hyoscyamine  (LEVSIN ) tablet 0.125 mg (has no administration in time range)  ketorolac  (TORADOL ) 15 MG/ML injection 15 mg (has no administration in time range)                                    Medical Decision Making Amount and/or Complexity of Data Reviewed Labs: ordered. Radiology: ordered.  Risk Prescription drug management.   Medical Decision Making / ED Course   This patient presents to the ED for concern of hematuria, this involves an extensive number of treatment options, and is a complaint that carries with it a high risk of complications and morbidity.  The differential diagnosis includes stent malposition, UTI, nephrolithiasis  MDM: 33 year old male presents with above-mentioned pain. Overall he is well-appearing. Hypertensive but otherwise hemodynamically stable. Offered pain medicine but he declines at this time. UA with evidence of hemoglobin.  No evidence of UTI.  CBC unremarkable, BMP without acute concern.  CT renal stone study with appropriate positioning  of the stent and otherwise without acute concern.  No significant hydronephrosis. Discussed these findings with Ole NP of urology.  Feels reassured by the workup.  States he is appropriate for discharge but does recommend adding hyoscyamine  to the regimen for stent related pain. Patient is comfortable with this plan.  Will discharge.  First dose of Levsin  and Toradol  given in the emergency department.  Lab Tests: -I ordered, reviewed, and interpreted labs.   The pertinent results include:   Labs Reviewed  URINALYSIS, ROUTINE W REFLEX MICROSCOPIC - Abnormal; Notable for the following components:      Result Value   Color, Urine RED (*)    APPearance CLOUDY (*)    Hgb urine dipstick LARGE (*)    Protein, ur 100 (*)    Leukocytes,Ua SMALL (*)    Bacteria, UA FEW (*)    All other components within normal limits  BASIC METABOLIC PANEL WITH GFR -  Abnormal; Notable for the following components:   Glucose, Bld 102 (*)    BUN 21 (*)    All other components within normal limits  CBC WITH DIFFERENTIAL/PLATELET      EKG  EKG Interpretation Date/Time:    Ventricular Rate:    PR Interval:    QRS Duration:    QT Interval:    QTC Calculation:   R Axis:      Text Interpretation:           Imaging Studies ordered: I ordered imaging studies including CT renal stone study I independently visualized and interpreted imaging. I agree with the radiologist interpretation   Medicines ordered and prescription drug management: Meds ordered this encounter  Medications   hyoscyamine  (ANASPAZ ) 0.125 MG TBDP disintergrating tablet    Sig: Place 1 tablet (0.125 mg total) under the tongue every 6 (six) hours as needed for bladder spasms.    Dispense:  30 tablet    Refill:  0    Supervising Provider:   CLEOTILDE ROGUE [[id number]]   hyoscyamine  (LEVSIN ) tablet 0.125 mg   ketorolac  (TORADOL ) 15 MG/ML injection 15 mg    -I have reviewed the patients home medicines and have made adjustments as needed  Reevaluation: After the interventions noted above, I reevaluated the patient and found that they have :improved  Co morbidities that complicate the patient evaluation  Past Medical History:  Diagnosis Date   Arthritis    in hands   Chronic kidney disease    Dysrhythmia    tachycardia   History of kidney stones    Hypertension    No meds   Medical history non-contributory       Dispostion: Discharged in stable condition.  Return precaution discussed.  Patient voices understanding and is in agreement with plan.    Final diagnoses:  Pain due to ureteral stent, initial encounter [facility])  Gross hematuria    ED Discharge Orders          Ordered    hyoscyamine  (ANASPAZ ) 0.125 MG TBDP disintergrating tablet  Every 6 hours PRN        09/30/23 0924               Hildegard Loge, PA-C 09/30/23 [id number]    Armenta Canning,  MD 10/02/23 1735

## 2023-09-30 NOTE — Discharge Instructions (Signed)
 Blood work, CT scan reassuring.  Urine did not show any signs of infection.  I discussed the case with neurology.  They felt reassured about the workup.  They did recommend adding hyoscyamine  to your regimen which I sent into the pharmacy for you.  He also received your first dose of this in the emergency department along with Toradol .  Take Tylenol , ibuprofen for pain control.  Use the narcotic pain medicine you are prescribed for severe or breakthrough pain.  Drink plenty of fluid.  Follow-up with the urology clinic in the office.  Return for any concerning symptoms.

## 2023-09-30 NOTE — Progress Notes (Signed)
 Sent message, via epic in basket, requesting orders in epic from Careers adviser.

## 2023-10-04 ENCOUNTER — Other Ambulatory Visit: Payer: Self-pay

## 2023-10-04 ENCOUNTER — Encounter (HOSPITAL_COMMUNITY): Payer: Self-pay | Admitting: Urology

## 2023-10-04 NOTE — Progress Notes (Addendum)
 For Anesthesia: PCP - None Cardiologist - N/A  Bowel Prep reminder:  Chest x-ray -  EKG - 09/21/23 Stress Test -  ECHO -  Cardiac Cath -  Pacemaker/ICD device last checked: Pacemaker orders received: Device Rep notified:  Spinal Cord Stimulator:N/A  Sleep Study - Yes CPAP - NO  Fasting Blood Sugar - N/A Checks Blood Sugar _____ times a day Date and result of last Hgb A1c-  Last dose of GLP1 agonist- N/A GLP1 instructions:   Last dose of SGLT-2 inhibitors- N/A SGLT-2 instructions:   Blood Thinner Instructions:N/A Aspirin Instructions: Last Dose:  Activity level: Can go up a flight of stairs and activities of daily living without stopping and without chest pain and/or shortness of breath   Able to exercise without chest pain and/or shortness of breath  Anesthesia review: Hx: HTN,Dysrhythmias. ED on : 09/30/23 with blood in urine,abdominal discomfort.  Patient denies shortness of breath, fever, cough and chest pain at PAT appointment   Patient verbalized understanding of instructions that were reviewed over the telephone.

## 2023-10-13 NOTE — Anesthesia Preprocedure Evaluation (Signed)
 Anesthesia Evaluation  Patient identified by MRN, date of birth, ID band Patient awake    Reviewed: Allergy & Precautions, NPO status , Patient's Chart, lab work & pertinent test results  Airway Mallampati: I  TM Distance: >3 FB Neck ROM: Full    Dental no notable dental hx. (+) Dental Advisory Given, Chipped,    Pulmonary neg pulmonary ROS   Pulmonary exam normal breath sounds clear to auscultation       Cardiovascular hypertension, (-) angina (-) Past MI Normal cardiovascular exam Rhythm:Regular Rate:Normal     Neuro/Psych negative neurological ROS  negative psych ROS   GI/Hepatic negative GI ROS, Neg liver ROS,,,  Endo/Other    Class 3 obesity (BMI 43)  Renal/GU Renal InsufficiencyRenal diseaseLab Results      Component                Value               Date                        K                        4.0                 09/30/2023                CO2                      29                  09/30/2023                BUN                      21 (H)              09/30/2023                CREATININE               0.77                09/30/2023                GFRNONAA                 >60                 09/30/2023               negative genitourinary   Musculoskeletal  (+) Arthritis ,    Abdominal   Peds  Hematology negative hematology ROS (+) Lab Results      Component                Value               Date                      WBC                      9.2                 09/30/2023                HGB  14.6                09/30/2023                HCT                      45.2                09/30/2023                MCV                      87.3                09/30/2023                PLT                      289                 09/30/2023              Anesthesia Other Findings   Reproductive/Obstetrics                              Anesthesia  Physical Anesthesia Plan  ASA: 3  Anesthesia Plan: General   Post-op Pain Management: Tylenol  PO (pre-op)*   Induction: Intravenous  PONV Risk Score and Plan: 2 and Ondansetron , Dexamethasone  and Midazolam   Airway Management Planned: LMA  Additional Equipment: None  Intra-op Plan:   Post-operative Plan: Extubation in OR  Informed Consent: I have reviewed the patients History and Physical, chart, labs and discussed the procedure including the risks, benefits and alternatives for the proposed anesthesia with the patient or authorized representative who has indicated his/her understanding and acceptance.     Dental advisory given  Plan Discussed with: CRNA and Surgeon  Anesthesia Plan Comments:          Anesthesia Quick Evaluation

## 2023-10-14 ENCOUNTER — Ambulatory Visit (HOSPITAL_COMMUNITY): Payer: Self-pay

## 2023-10-14 ENCOUNTER — Ambulatory Visit (HOSPITAL_COMMUNITY)
Admission: RE | Admit: 2023-10-14 | Discharge: 2023-10-14 | Disposition: A | Discharge status: Home / Self Care | Payer: Self-pay | Attending: Urology | Admitting: Urology

## 2023-10-14 ENCOUNTER — Encounter (HOSPITAL_COMMUNITY)
Admission: RE | Disposition: A | Discharge status: Home / Self Care | Payer: Self-pay | Source: Home / Self Care | Attending: Urology

## 2023-10-14 ENCOUNTER — Encounter (HOSPITAL_COMMUNITY): Payer: Self-pay | Admitting: Urology

## 2023-10-14 ENCOUNTER — Ambulatory Visit (HOSPITAL_COMMUNITY): Payer: Self-pay | Admitting: Anesthesiology

## 2023-10-14 ENCOUNTER — Encounter (HOSPITAL_COMMUNITY): Payer: Self-pay | Admitting: Anesthesiology

## 2023-10-14 DIAGNOSIS — Z8249 Family history of ischemic heart disease and other diseases of the circulatory system: Secondary | ICD-10-CM | POA: Insufficient documentation

## 2023-10-14 DIAGNOSIS — E66813 Obesity, class 3: Secondary | ICD-10-CM | POA: Insufficient documentation

## 2023-10-14 DIAGNOSIS — N189 Chronic kidney disease, unspecified: Secondary | ICD-10-CM

## 2023-10-14 DIAGNOSIS — I129 Hypertensive chronic kidney disease with stage 1 through stage 4 chronic kidney disease, or unspecified chronic kidney disease: Secondary | ICD-10-CM | POA: Insufficient documentation

## 2023-10-14 DIAGNOSIS — N200 Calculus of kidney: Secondary | ICD-10-CM

## 2023-10-14 DIAGNOSIS — Z6841 Body Mass Index (BMI) 40.0 and over, adult: Secondary | ICD-10-CM | POA: Insufficient documentation

## 2023-10-14 DIAGNOSIS — M1990 Unspecified osteoarthritis, unspecified site: Secondary | ICD-10-CM | POA: Insufficient documentation

## 2023-10-14 HISTORY — PX: CYSTOSCOPY/URETEROSCOPY/HOLMIUM LASER/STENT PLACEMENT: SHX6546

## 2023-10-14 SURGERY — CYSTOSCOPY/URETEROSCOPY/HOLMIUM LASER/STENT PLACEMENT
Anesthesia: General | Laterality: Right

## 2023-10-14 MED ORDER — FENTANYL CITRATE (PF) 100 MCG/2ML IJ SOLN
INTRAMUSCULAR | Status: AC
Start: 2023-10-14 — End: 2023-10-14
  Filled 2023-10-14: qty 2

## 2023-10-14 MED ORDER — DEXAMETHASONE SODIUM PHOSPHATE 10 MG/ML IJ SOLN
INTRAMUSCULAR | Status: DC | PRN
  Administered 2023-10-14: 4 mg via INTRAVENOUS

## 2023-10-14 MED ORDER — SODIUM CHLORIDE 0.9 % IR SOLN
Status: DC | PRN
  Administered 2023-10-14: 3000 mL via INTRAVESICAL

## 2023-10-14 MED ORDER — KETOROLAC TROMETHAMINE 30 MG/ML IJ SOLN
INTRAMUSCULAR | Status: AC
  Filled 2023-10-14: qty 1

## 2023-10-14 MED ORDER — LIDOCAINE HCL (PF) 2 % IJ SOLN
INTRAMUSCULAR | Status: DC | PRN
  Administered 2023-10-14: 80 mg via INTRADERMAL

## 2023-10-14 MED ORDER — IOHEXOL 300 MG/ML  SOLN
INTRAMUSCULAR | Status: DC | PRN
  Administered 2023-10-14: 8 mL

## 2023-10-14 MED ORDER — AMISULPRIDE (ANTIEMETIC) 5 MG/2ML IV SOLN: 10.0000 mg | Freq: Once | INTRAVENOUS | Status: DC | PRN

## 2023-10-14 MED ORDER — CEFAZOLIN SODIUM 1 G IJ SOLR
INTRAMUSCULAR | Status: AC
  Filled 2023-10-14: qty 10

## 2023-10-14 MED ORDER — LACTATED RINGERS IV SOLN: INTRAVENOUS | Status: DC

## 2023-10-14 MED ORDER — MIDAZOLAM HCL 2 MG/2ML IJ SOLN
INTRAMUSCULAR | Status: DC | PRN
  Administered 2023-10-14: 2 mg via INTRAVENOUS

## 2023-10-14 MED ORDER — DEXTROSE 5 % IV SOLN
INTRAVENOUS | Status: DC | PRN
  Administered 2023-10-14: 3 g via INTRAVENOUS

## 2023-10-14 MED ORDER — OXYCODONE HCL 5 MG/5ML PO SOLN: 5.0000 mg | Freq: Once | ORAL | Status: DC | PRN

## 2023-10-14 MED ORDER — ONDANSETRON HCL 4 MG/2ML IJ SOLN
INTRAMUSCULAR | Status: AC
  Filled 2023-10-14: qty 2

## 2023-10-14 MED ORDER — FENTANYL CITRATE (PF) 100 MCG/2ML IJ SOLN
INTRAMUSCULAR | Status: DC | PRN
  Administered 2023-10-14 (×4): 50 ug via INTRAVENOUS

## 2023-10-14 MED ORDER — DEXAMETHASONE SODIUM PHOSPHATE 10 MG/ML IJ SOLN
INTRAMUSCULAR | Status: AC
  Filled 2023-10-14: qty 1

## 2023-10-14 MED ORDER — FENTANYL CITRATE (PF) 100 MCG/2ML IJ SOLN
INTRAMUSCULAR | Status: AC
  Filled 2023-10-14: qty 2

## 2023-10-14 MED ORDER — PROPOFOL 10 MG/ML IV BOLUS
INTRAVENOUS | Status: DC | PRN
Start: 2023-10-14 — End: 2023-10-14
  Administered 2023-10-14: 200 mg via INTRAVENOUS

## 2023-10-14 MED ORDER — KETOROLAC TROMETHAMINE 30 MG/ML IJ SOLN
INTRAMUSCULAR | Status: DC | PRN
Start: 2023-10-14 — End: 2023-10-14
  Administered 2023-10-14: 30 mg via INTRAVENOUS

## 2023-10-14 MED ORDER — HYDROMORPHONE HCL 1 MG/ML IJ SOLN: 0.2500 mg | INTRAMUSCULAR | Status: DC | PRN

## 2023-10-14 MED ORDER — ONDANSETRON HCL 4 MG/2ML IJ SOLN
INTRAMUSCULAR | Status: DC | PRN
  Administered 2023-10-14: 4 mg via INTRAVENOUS

## 2023-10-14 MED ORDER — 0.9 % SODIUM CHLORIDE (POUR BTL) OPTIME
TOPICAL | Status: DC | PRN
  Administered 2023-10-14: 1000 mL

## 2023-10-14 MED ORDER — CEFAZOLIN SODIUM-DEXTROSE 2-4 GM/100ML-% IV SOLN
INTRAVENOUS | Status: AC
  Filled 2023-10-14: qty 100

## 2023-10-14 MED ORDER — MIDAZOLAM HCL 2 MG/2ML IJ SOLN
INTRAMUSCULAR | Status: AC
  Filled 2023-10-14: qty 2

## 2023-10-14 MED ORDER — METOPROLOL TARTRATE 5 MG/5ML IV SOLN
INTRAVENOUS | Status: AC
  Filled 2023-10-14: qty 5

## 2023-10-14 MED ORDER — ACETAMINOPHEN 10 MG/ML IV SOLN: 1000.0000 mg | Freq: Once | INTRAVENOUS | Status: DC | PRN

## 2023-10-14 MED ORDER — OXYCODONE HCL 5 MG PO TABS: 5.0000 mg | ORAL_TABLET | Freq: Once | ORAL | Status: DC | PRN

## 2023-10-14 MED ORDER — LIDOCAINE HCL (PF) 2 % IJ SOLN
INTRAMUSCULAR | Status: AC
  Filled 2023-10-14: qty 5

## 2023-10-14 MED ORDER — PROPOFOL 10 MG/ML IV BOLUS
INTRAVENOUS | Status: AC
  Filled 2023-10-14: qty 20

## 2023-10-14 MED ORDER — ONDANSETRON HCL 4 MG/2ML IJ SOLN: 4.0000 mg | Freq: Once | INTRAMUSCULAR | Status: DC | PRN

## 2023-10-14 MED ORDER — CHLORHEXIDINE GLUCONATE 0.12 % MT SOLN
15.0000 mL | Freq: Once | OROMUCOSAL | Status: AC
  Administered 2023-10-14: 15 mL via OROMUCOSAL

## 2023-10-14 SURGICAL SUPPLY — 18 items
BAG URO CATCHER STRL LF (MISCELLANEOUS) ×1 IMPLANT
BASKET ZERO TIP NITINOL 2.4FR (BASKET) IMPLANT
CATH URETL OPEN 5X70 (CATHETERS) ×1 IMPLANT
CLOTH BEACON ORANGE TIMEOUT ST (SAFETY) ×1 IMPLANT
EXTRACTOR STONE NITINOL NGAGE (UROLOGICAL SUPPLIES) IMPLANT
GLOVE SURG LX STRL 7.5 STRW (GLOVE) ×1 IMPLANT
GOWN STRL REUS W/ TWL XL LVL3 (GOWN DISPOSABLE) ×1 IMPLANT
GUIDEWIRE ANG ZIPWIRE 038X150 (WIRE) IMPLANT
GUIDEWIRE STR DUAL SENSOR (WIRE) ×1 IMPLANT
KIT TURNOVER KIT A (KITS) ×1 IMPLANT
MANIFOLD NEPTUNE II (INSTRUMENTS) ×1 IMPLANT
PACK CYSTO (CUSTOM PROCEDURE TRAY) ×1 IMPLANT
SHEATH NAVIGATOR HD 12/14X36 (SHEATH) IMPLANT
STENT URET 6FRX28 CONTOUR (STENTS) IMPLANT
TRACTIP FLEXIVA PULS ID 200XHI (Laser) IMPLANT
TRACTIP FLEXIVA PULSE ID 200 (Laser) IMPLANT
TUBING CONNECTING 10 (TUBING) ×1 IMPLANT
TUBING UROLOGY SET (TUBING) ×1 IMPLANT

## 2023-10-14 NOTE — Interval H&P Note (Signed)
 History and Physical Interval Note:  10/14/2023 12:41 PM  Vincent Doyle  has presented today for surgery, with the diagnosis of RIGHT RENAL STONE.  The various methods of treatment have been discussed with the patient and family. After consideration of risks, benefits and other options for treatment, the patient has consented to  Procedure(s): CYSTOSCOPY/URETEROSCOPY/HOLMIUM LASER/STENT PLACEMENT (Right) as a surgical intervention.  The patient's history has been reviewed, patient examined, no change in status, stable for surgery.  I have reviewed the patient's chart and labs.  Questions were answered to the patient's satisfaction.     [name]

## 2023-10-14 NOTE — Discharge Instructions (Signed)
 DISCHARGE INSTRUCTIONS FOR KIDNEY STONE/URETERAL STENT   MEDICATIONS:  1.  Resume all your other meds from home - except do not take any extra narcotic pain meds that you may have at home.  2.  Take tamsulosin  until the pain subsides and then it can be safely stopped.  ACTIVITY:  1. No strenuous activity x 1week  2. No driving while on narcotic pain medications  3. Drink plenty of water  4. Continue to walk at home - you can still get blood clots when you are at home, so keep active, but don't over do it.  5. May return to work/school tomorrow or when you feel ready   BATHING:  1. You can shower and we recommend daily showers  2. You have a string coming from your urethra: The stent string is attached to your ureteral stent. Do not pull on this.   SIGNS/SYMPTOMS TO CALL:  Please call us  if you have a fever greater than 101.5, uncontrolled nausea/vomiting, uncontrolled pain, dizziness, unable to urinate, bloody urine, chest pain, shortness of breath, leg swelling, leg pain, redness around wound, drainage from wound, or any other concerns or questions.   You can reach us  at [phone].   FOLLOW-UP:  1. You have an appointment in 6 weeks with a ultrasound of your kidneys prior.   2. You have a string attached to your stent, you may remove it on Monday, August 11th. To do this, pull the strings until the stents are completely removed. You may feel an odd sensation in your back.

## 2023-10-14 NOTE — Anesthesia Procedure Notes (Signed)
 Procedure Name: LMA Insertion Date/Time: 10/14/2023 1:50 PM  Performed by: Gladis Honey, CRNAPre-anesthesia Checklist: Patient identified, Emergency Drugs available, Suction available and Patient being monitored Patient Re-evaluated:Patient Re-evaluated prior to induction Oxygen Delivery Method: Circle System Utilized Preoxygenation: Pre-oxygenation with 100% oxygen Induction Type: IV induction Ventilation: Mask ventilation without difficulty LMA: LMA inserted LMA Size: 5.0 Number of attempts: 1 Airway Equipment and Method: Bite block Placement Confirmation: positive ETCO2 Tube secured with: Tape Dental Injury: Teeth and Oropharynx as per pre-operative assessment

## 2023-10-14 NOTE — Transfer of Care (Signed)
 Immediate Anesthesia Transfer of Care Note  Patient: Vincent Doyle  Procedure(s) Performed: CYSTOSCOPY/URETEROSCOPY/HOLMIUM LASER/STENT PLACEMENT/RIGHT RETROGRADE PYELOGRAM (Right)  Patient Location: PACU  Anesthesia Type:General  Level of Consciousness: awake, alert , and oriented  Airway & Oxygen Therapy: Patient Spontanous Breathing and Patient connected to face mask oxygen  Post-op Assessment: Report given to RN and Post -op Vital signs reviewed and stable  Post vital signs: Reviewed and stable  Last Vitals:  Vitals Value Taken Time  BP 154/99 10/14/23 14:15  Temp    Pulse 82 10/14/23 14:17  Resp 16 10/14/23 14:17  SpO2 98 % 10/14/23 14:17  Vitals shown include unfiled device data.  Last Pain:  Vitals:   10/14/23 0936  TempSrc:   PainSc: 0-No pain         Complications: No notable events documented.

## 2023-10-14 NOTE — Anesthesia Postprocedure Evaluation (Signed)
 Anesthesia Post Note  Patient: Vincent Doyle  Procedure(s) Performed: CYSTOSCOPY/URETEROSCOPY/HOLMIUM LASER/STENT PLACEMENT/RIGHT RETROGRADE PYELOGRAM (Right)     Patient location during evaluation: PACU Anesthesia Type: General Level of consciousness: awake and alert Pain management: pain level controlled Vital Signs Assessment: post-procedure vital signs reviewed and stable Respiratory status: spontaneous breathing, nonlabored ventilation, respiratory function stable and patient connected to nasal cannula oxygen Cardiovascular status: blood pressure returned to baseline and stable Postop Assessment: no apparent nausea or vomiting Anesthetic complications: no   No notable events documented.  Last Vitals:  Vitals:   10/14/23 1445 10/14/23 1457  BP: (!) 146/93 (!) 141/100  Pulse: 78 80  Resp: 14   Temp: 36.7 C   SpO2: 96% 98%    Last Pain:  Vitals:   10/14/23 1457  TempSrc:   PainSc: 0-No pain                 Vincent Doyle

## 2023-10-14 NOTE — Op Note (Signed)
 Preoperative diagnosis:  Right renal pelvic stone   Postoperative diagnosis:  same   Procedure: Right stent exchange Right ureteroscopy/laser lithotripsy and stone extraction Right retrograde pyelogram  Surgeon: Morene MICAEL Salines, MD  Anesthesia: General  Complications: None  Intraoperative findings:  #1 - Right retrograde pyelogram demonstrated normal caliber ureter with a small filling defect in the mid-pelvis.  The was no hydronephrosis. #2 - Ureter was tight proximally.  Opted not to fragment stone and remove pieces and instead to dust the stone and allow the pieces to pass.  Stone was pushed into the upper pole prior to dusting it. #3 - Stent was exchanged with an 28cm x 23F right ureteral stent.  EBL: Minimal  Specimens: Stone fragments sent for stone analysis.  Indication: Vincent Doyle is a 33 y.o. patient with symptomatic right flank pain and gross hematuria who underwent attempted right ureteroscopy about 2 weeks ago unsuccessfully because of a tight and prohibitive ureter.  After reviewing the management options for treatment, he elected to proceed with the above surgical procedure(s). We have discussed the potential benefits and risks of the procedure, side effects of the proposed treatment, the likelihood of the patient achieving the goals of the procedure, and any potential problems that might occur during the procedure or recuperation. Informed consent has been obtained.  Description of procedure:   Consent was obtained the preoperative holding area.  He was brought back to the operating room and placed on the table in supine position.  General esthesia was then induced and endotracheal tube was inserted.  Placed in dorsal lithotomy position and prepped and draped in the routine sterile fashion.  Timeout was then performed.  21 French degree cystoscope was only passed through the patient's urethra into the bladder under visual guidance.  Cystoscopy demonstrated  normal-appearing bladder with some edema emanating from the patient's right ureteral orifice.  The stent was grasped at the tip and brought to the urethral meatus.  A sensor wire was advanced up through the stent and up into the right renal pelvis and the stent removed over the wire.  I then advanced a dual lumen up into the distal ureter and performed a right retrograde pyelogram with the above findings.  Subsequently advanced a second wire up to the dual-lumen catheter removing the catheter over the wire.  I then used a 12/14 [place] ureteral access sheath advanced over the second wire into the proximal ureter.  I then used a flexible ureteroscope and advanced it up into the patient's right renal pelvis.  I was able to easily encountered the patient's stone and pushed it to the upper pole.  Using a 200 m laser fiber I fragmented the stone into small dust sized particles.  I removed a few small pieces and sent them for stone analysis using a N-gage basket.  I was then slowly backed the scope out of the ureter and inspected the ureter upon exit noting no siginificant ureteral trauma.  I then passed the cystoscope back into the patients bladder over the wire.  Using a 28cm x 4f double J ureteral stent - I placed it over the wire and into the right upper pole under fluoroscopy.  Once it was well within the kidney I advanced it to the bladder neck prior to removing the wire.  A nice curl was noted within the bladder as well.  I then emptied his bladder and pulled the stent tether through the urethral and taped it to the dorsum of his penis.  He was subsequently extubated and returned to the PACU in stable condition.  Dispo: Patient discharged home and instructed to remove his stent on Monday, August 11th.  He has f/u in 6 weeks with an ultrasound prior.

## 2023-10-15 ENCOUNTER — Encounter (HOSPITAL_COMMUNITY): Payer: Self-pay | Admitting: Urology

## 2023-10-27 LAB — STONE ANALYSIS
Calcium Oxalate Dihydrate: 70 %
Calcium Oxalate Monohydrate: 20 %
Calcium Phosphate (Hydroxyl): 10 %
Weight Calculi: 17 mg
